# Patient Record
Sex: Female | Born: 2000 | Race: Black or African American | Hispanic: No | Marital: Single | Smoking: Former smoker
Health system: Southern US, Community
[De-identification: ages and names within clinical notes are randomized; demographics above are authoritative.]

## PROBLEM LIST (undated history)

## (undated) DIAGNOSIS — D649 Anemia, unspecified: Secondary | ICD-10-CM

## (undated) DIAGNOSIS — J45909 Unspecified asthma, uncomplicated: Secondary | ICD-10-CM

## (undated) DIAGNOSIS — F99 Mental disorder, not otherwise specified: Secondary | ICD-10-CM

## (undated) HISTORY — DX: Anemia, unspecified: D64.9

## (undated) HISTORY — PX: NO PAST SURGERIES: SHX2092

## (undated) HISTORY — DX: Mental disorder, not otherwise specified: F99

---
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
## 2020-07-27 ENCOUNTER — Other Ambulatory Visit: Payer: Self-pay

## 2020-07-27 ENCOUNTER — Encounter: Payer: Self-pay | Admitting: Emergency Medicine

## 2020-07-27 ENCOUNTER — Emergency Department: Payer: Medicaid Other

## 2020-07-27 ENCOUNTER — Emergency Department
Admission: EM | Admit: 2020-07-27 | Discharge: 2020-07-28 | Disposition: A | Discharge status: Home / Self Care | Payer: Medicaid Other | Attending: Emergency Medicine | Admitting: Emergency Medicine

## 2020-07-27 DIAGNOSIS — F1590 Other stimulant use, unspecified, uncomplicated: Secondary | ICD-10-CM | POA: Diagnosis not present

## 2020-07-27 DIAGNOSIS — R202 Paresthesia of skin: Secondary | ICD-10-CM

## 2020-07-27 DIAGNOSIS — J45909 Unspecified asthma, uncomplicated: Secondary | ICD-10-CM | POA: Diagnosis not present

## 2020-07-27 DIAGNOSIS — R200 Anesthesia of skin: Secondary | ICD-10-CM | POA: Diagnosis present

## 2020-07-27 LAB — COMPREHENSIVE METABOLIC PANEL
ALT: 10 U/L (ref 0–44)
AST: 17 U/L (ref 15–41)
Albumin: 4.4 g/dL (ref 3.5–5.0)
Alkaline Phosphatase: 121 U/L (ref 38–126)
Anion gap: 9 (ref 5–15)
BUN: 20 mg/dL (ref 6–20)
CO2: 26 mmol/L (ref 22–32)
Calcium: 9.4 mg/dL (ref 8.9–10.3)
Chloride: 105 mmol/L (ref 98–111)
Creatinine, Ser: 0.68 mg/dL (ref 0.44–1.00)
GFR calc non Af Amer: >60 mL/min (ref >60)
Glucose, Bld: 93 mg/dL (ref 70–99)
Potassium: 3.8 mmol/L (ref 3.5–5.1)
Sodium: 140 mmol/L (ref 135–145)
Total Bilirubin: 0.5 mg/dL (ref 0.3–1.2)
Total Protein: 7.6 g/dL (ref 6.5–8.1)

## 2020-07-27 LAB — DIFFERENTIAL
Abs Immature Granulocytes: 0.01 10*3/uL (ref 0.00–0.07)
Basophils Absolute: 0.1 10*3/uL (ref 0.0–0.1)
Basophils Relative: 1 %
Eosinophils Absolute: 0.1 10*3/uL (ref 0.0–0.5)
Eosinophils Relative: 2 %
Immature Granulocytes: 0 %
Lymphocytes Relative: 43 %
Lymphs Abs: 2.7 10*3/uL (ref 0.7–4.0)
Monocytes Absolute: 0.6 10*3/uL (ref 0.1–1.0)
Monocytes Relative: 10 %
Neutro Abs: 2.8 10*3/uL (ref 1.7–7.7)
Neutrophils Relative %: 44 %

## 2020-07-27 LAB — CBC
HCT: 38.4 % (ref 36.0–46.0)
Hemoglobin: 12.7 g/dL (ref 12.0–15.0)
MCH: 27 pg (ref 26.0–34.0)
MCHC: 33.1 g/dL (ref 30.0–36.0)
MCV: 81.7 fL (ref 80.0–100.0)
Platelets: 255 10*3/uL (ref 150–400)
RBC: 4.7 MIL/uL (ref 3.87–5.11)
RDW: 13.7 % (ref 11.5–15.5)
WBC: 6.3 10*3/uL (ref 4.0–10.5)
nRBC: 0 % (ref 0.0–0.2)

## 2020-07-27 LAB — PROTIME-INR
INR: 1 (ref 0.8–1.2)
Prothrombin Time: 12.7 seconds (ref 11.4–15.2)

## 2020-07-27 LAB — APTT: aPTT: 28 seconds (ref 24–36)

## 2020-07-27 LAB — GLUCOSE, CAPILLARY: Glucose-Capillary: 109 mg/dL — ABNORMAL HIGH (ref 70–99)

## 2020-07-27 LAB — TROPONIN I (HIGH SENSITIVITY): Troponin I (High Sensitivity): <2 ng/L (ref <18)
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
## 2020-07-27 NOTE — ED Triage Notes (Addendum)
Pt also reports intermittent chest pain recently, but states she has not had chest pain since last week and denies any pain currently.
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
## 2020-07-28 ENCOUNTER — Emergency Department: Payer: Medicaid Other

## 2020-07-28 LAB — FIBRIN DERIVATIVES D-DIMER (ARMC ONLY): Fibrin derivatives D-dimer (ARMC): 210.73 ng/mL (FEU) (ref 0.00–499.00)

## 2020-07-28 MED ORDER — GADOBUTROL 1 MMOL/ML IV SOLN
6.0000 mL | Freq: Once | INTRAVENOUS | Status: AC | PRN
  Administered 2020-07-28: 6 mL via INTRAVENOUS

## 2020-07-28 NOTE — ED Provider Notes (Signed)
[facility] Emergency Department Provider Note  ____________________________________________   First MD Initiated Contact with Patient 07/28/20 0000     (approximate)  I have reviewed the triage vital signs and the nursing notes.   HISTORY  Chief Complaint Numbness    HPI Kendra Oconnor is a 19 y.o. female with history of asthma presents to the emergency department secondary to 10-minute episode of left face/arm numbness which began at 6 PM while the patient was sitting on her couch.  Patient denies any weakness no nausea or vomiting.  Patient states that she did not have a headache at that point however has developed one since then.  Patient denies any fever no recent illness.  Patient denies any chest pain no shortness of breath.        Past Medical History:  Diagnosis Date  . Asthma     There are no problems to display for this patient.   History reviewed. No pertinent surgical history.  Prior to Admission medications   Not on File    Allergies Patient has no known allergies.  No family history on file.  Social History Social History   Tobacco Use  . Smoking status: Never Smoker  . Smokeless tobacco: Never Used  Vaping Use  . Vaping Use: Never used  Substance Use Topics  . Alcohol use: No  . Drug use: Yes    Types: Marijuana    Review of Systems Constitutional: No fever/chills Eyes: No visual changes. ENT: No sore throat. Cardiovascular: Denies chest pain. Respiratory: Denies shortness of breath. Gastrointestinal: No abdominal pain.  No nausea, no vomiting.  No diarrhea.  No constipation. Genitourinary: Negative for dysuria. Musculoskeletal: Negative for neck pain.  Negative for back pain. Integumentary: Negative for rash. Neurological: Negative for headaches, focal weakness.  Positive for left face/arm numbness  ____________________________________________   PHYSICAL EXAM:  VITAL SIGNS: ED Triage Vitals  Enc  Vitals Group     BP 07/27/20 2053 118/70     Pulse Rate 07/27/20 2053 82     Resp 07/27/20 2053 18     Temp --      Temp Source 07/27/20 2053 Oral     SpO2 07/27/20 2053 100 %     Weight 07/27/20 2101 65.3 kg (144 lb)     Height 07/27/20 2101 1.702 m (5\' 7" )     Head Circumference --      Peak Flow --      Pain Score 07/27/20 2101 4     Pain Loc --      Pain Edu? --      Excl. in GC? --     Constitutional: Alert and oriented.  Eyes: Conjunctivae are normal.  Head: Atraumatic. Mouth/Throat: Patient is wearing a mask. Neck: No stridor.  No meningeal signs.   Cardiovascular: Normal rate, regular rhythm. Good peripheral circulation. Grossly normal heart sounds. Respiratory: Normal respiratory effort.  No retractions. Gastrointestinal: Soft and nontender. No distention.  Musculoskeletal: No lower extremity tenderness nor edema. No gross deformities of extremities. Neurologic:  Normal speech and language. No gross focal neurologic deficits are appreciated.  Skin:  Skin is warm, dry and intact. Psychiatric: Mood and affect are normal. Speech and behavior are normal.  ____________________________________________   LABS (all labs ordered are listed, but only abnormal results are displayed)  Labs Reviewed  GLUCOSE, CAPILLARY - Abnormal; Notable for the following components:      Result Value   Glucose-Capillary 109 (*)    All  other components within normal limits  PROTIME-INR  APTT  CBC  DIFFERENTIAL  COMPREHENSIVE METABOLIC PANEL  FIBRIN DERIVATIVES D-DIMER ([facility] ONLY)  CBG MONITORING, ED  POC URINE PREG, ED  TROPONIN I (HIGH SENSITIVITY)   ____________________________________________  EKG  ED ECG REPORT I, Mosinee N Dewan Emond, the attending physician, personally viewed and interpreted this ECG.   Date: 07/27/2020  EKG Time: 9:01 PM  Rate: 82  Rhythm: Normal sinus rhythm  Axis: Normal  Intervals: Normal  ST&T Change:  None  ____________________________________________  RADIOLOGY I,  N Shaylin Blatt, personally viewed and evaluated these images (plain radiographs) as part of my medical decision making, as well as reviewing the written report by the radiologist.  ED MD interpretation: Negative CT head and MRI MRV  Official radiology report(s): CT HEAD WO CONTRAST  Result Date: 07/27/2020 CLINICAL DATA:  Left arm numbness and face numbness EXAM: CT HEAD WITHOUT CONTRAST TECHNIQUE: Contiguous axial images were obtained from the base of the skull through the vertex without intravenous contrast. COMPARISON:  None. FINDINGS: Brain: There is no acute intracranial hemorrhage, mass effect, or edema. Gray-white differentiation is preserved. There is no extra-axial fluid collection. Ventricles and sulci are within normal limits in size and configuration. Vascular: There is atherosclerotic calcification at the skull base. Skull: Calvarium is unremarkable. Sinuses/Orbits: No acute finding. Other: None. IMPRESSION: No acute intracranial hemorrhage or evidence of acute infarction. Electronically Signed   By: Guadlupe Spanish M.D.   On: 07/27/2020 21:37   MR BRAIN WO CONTRAST  Result Date: 07/28/2020 CLINICAL DATA:  Initial evaluation for neuro deficit, stroke suspected, left facial and arm numbness. EXAM: MRI HEAD WITHOUT CONTRAST MRV HEAD WITHOUT AND WITH CONTRAST TECHNIQUE: Multiplanar, multiecho pulse sequences of the brain and surrounding structures were obtained without intravenous contrast. Angiographic images of the intracranial venous structures were obtained using MRV technique without and with intravenous contrast. CONTRAST:  52mL GADAVIST GADOBUTROL 1 MMOL/ML IV SOLN COMPARISON:  None. FINDINGS: MRI HEAD FINDINGS: Brain: Cerebral volume within normal limits for patient age. No focal parenchymal signal abnormality identified. No abnormal foci of restricted diffusion to suggest acute or subacute ischemia. Gray-white matter  differentiation well maintained. No encephalomalacia to suggest chronic infarction. No foci of susceptibility artifact to suggest acute or chronic intracranial hemorrhage. No mass lesion, midline shift or mass effect. No hydrocephalus. No extra-axial fluid collection. Major dural sinuses are grossly patent. Pituitary gland and suprasellar region are normal. Midline structures intact and normal. Vascular: Major intracranial vascular flow voids well maintained and normal in appearance. Skull and upper cervical spine: Craniocervical junction normal. Visualized upper cervical spine within normal limits. Bone marrow signal intensity normal. No scalp soft tissue abnormality. Sinuses/Orbits: Globes and orbital soft tissues within normal limits. Paranasal sinuses are clear. No mastoid effusion. Inner ear structures normal. Other: None. MRV HEAD FINDINGS: Normal flow related signal seen throughout the superior sagittal sinus to the level of the torcula. Torcula itself is patent. Transverse and sigmoid sinuses are widely patent as are the visualized proximal internal jugular veins. Right transverse sinus dominant. Straight sinus, vein of Galen, internal cerebral veins, and basal veins of Rosenthal are patent. No evidence for dural sinus thrombosis. No dural sinus stenosis identified. No other pathologic enhancement noted within the brain. IMPRESSION: 1. Normal brain MRI. No acute intracranial abnormality identified. 2. Normal intracranial MRV. No evidence for dural sinus thrombosis. Electronically Signed   By: Rise Mu M.D.   On: 07/28/2020 02:58   MR MRV HEAD W WO CONTRAST  Result  Date: 07/28/2020 CLINICAL DATA:  Initial evaluation for neuro deficit, stroke suspected, left facial and arm numbness. EXAM: MRI HEAD WITHOUT CONTRAST MRV HEAD WITHOUT AND WITH CONTRAST TECHNIQUE: Multiplanar, multiecho pulse sequences of the brain and surrounding structures were obtained without intravenous contrast. Angiographic  images of the intracranial venous structures were obtained using MRV technique without and with intravenous contrast. CONTRAST:  7mL GADAVIST GADOBUTROL 1 MMOL/ML IV SOLN COMPARISON:  None. FINDINGS: MRI HEAD FINDINGS: Brain: Cerebral volume within normal limits for patient age. No focal parenchymal signal abnormality identified. No abnormal foci of restricted diffusion to suggest acute or subacute ischemia. Gray-white matter differentiation well maintained. No encephalomalacia to suggest chronic infarction. No foci of susceptibility artifact to suggest acute or chronic intracranial hemorrhage. No mass lesion, midline shift or mass effect. No hydrocephalus. No extra-axial fluid collection. Major dural sinuses are grossly patent. Pituitary gland and suprasellar region are normal. Midline structures intact and normal. Vascular: Major intracranial vascular flow voids well maintained and normal in appearance. Skull and upper cervical spine: Craniocervical junction normal. Visualized upper cervical spine within normal limits. Bone marrow signal intensity normal. No scalp soft tissue abnormality. Sinuses/Orbits: Globes and orbital soft tissues within normal limits. Paranasal sinuses are clear. No mastoid effusion. Inner ear structures normal. Other: None. MRV HEAD FINDINGS: Normal flow related signal seen throughout the superior sagittal sinus to the level of the torcula. Torcula itself is patent. Transverse and sigmoid sinuses are widely patent as are the visualized proximal internal jugular veins. Right transverse sinus dominant. Straight sinus, vein of Galen, internal cerebral veins, and basal veins of Rosenthal are patent. No evidence for dural sinus thrombosis. No dural sinus stenosis identified. No other pathologic enhancement noted within the brain. IMPRESSION: 1. Normal brain MRI. No acute intracranial abnormality identified. 2. Normal intracranial MRV. No evidence for dural sinus thrombosis. Electronically Signed    By: Rise Mu M.D.   On: 07/28/2020 02:58    ____________________________________________   PROCEDURES   Procedure(s) performed (including Critical Care):  Procedures   ____________________________________________   INITIAL IMPRESSION / MDM / ASSESSMENT AND PLAN / ED COURSE  As part of my medical decision making, I reviewed the following data within the electronic MEDICAL RECORD NUMBER 19 year old female presented with above-stated history and physical exam a differential diagnosis including but not limited to paresthesia, migraine, dural sinus thrombosis, CVA. CT head as well as MRI MRV negative. ____________________________________________  FINAL CLINICAL IMPRESSION(S) / ED DIAGNOSES  Final diagnoses:  Paresthesia     MEDICATIONS GIVEN DURING THIS VISIT:  Medications  gadobutrol (GADAVIST) 1 MMOL/ML injection 6 mL (6 mLs Intravenous Contrast Given 07/28/20 0201)     ED Discharge Orders    None      *Please note:  EUREKA VALDES was evaluated in Emergency Department on 07/28/2020 for the symptoms described in the history of present illness. She was evaluated in the context of the global COVID-19 pandemic, which necessitated consideration that the patient might be at risk for infection with the SARS-CoV-2 virus that causes COVID-19. Institutional protocols and algorithms that pertain to the evaluation of patients at risk for COVID-19 are in a state of rapid change based on information released by regulatory bodies including the CDC and federal and state organizations. These policies and algorithms were followed during the patient's care in the ED.  Some ED evaluations and interventions may be delayed as a result of limited staffing during and after the pandemic.*  Note:  This document was prepared using Dragon voice  recognition software and may include unintentional dictation errors.   Darci Current, MD 07/28/20 [phone]

## 2020-07-28 NOTE — ED Notes (Signed)
PT returned from MRI.

## 2020-07-28 NOTE — ED Notes (Signed)
ED Provider at bedside. 

## 2020-07-28 NOTE — ED Notes (Signed)
PT transported to MRI

## 2020-08-06 ENCOUNTER — Ambulatory Visit: Payer: Medicaid Other

## 2020-10-18 ENCOUNTER — Emergency Department: Payer: Medicaid Other

## 2020-10-18 ENCOUNTER — Other Ambulatory Visit: Payer: Self-pay

## 2020-10-18 ENCOUNTER — Emergency Department
Admission: EM | Admit: 2020-10-18 | Discharge: 2020-10-18 | Disposition: A | Discharge status: Home / Self Care | Payer: Medicaid Other | Attending: Emergency Medicine | Admitting: Emergency Medicine

## 2020-10-18 DIAGNOSIS — J069 Acute upper respiratory infection, unspecified: Secondary | ICD-10-CM | POA: Insufficient documentation

## 2020-10-18 DIAGNOSIS — U071 COVID-19: Secondary | ICD-10-CM | POA: Diagnosis not present

## 2020-10-18 DIAGNOSIS — R059 Cough, unspecified: Secondary | ICD-10-CM | POA: Diagnosis present

## 2020-10-18 LAB — RESP PANEL BY RT-PCR (FLU A&B, COVID) ARPGX2
Influenza A by PCR: NEGATIVE
Influenza B by PCR: NEGATIVE
SARS Coronavirus 2 by RT PCR: POSITIVE — AB

## 2020-10-18 MED ORDER — BENZONATATE 100 MG PO CAPS: 100.0000 mg | ORAL_CAPSULE | Freq: Three times a day (TID) | ORAL | 0 refills | Status: AC | PRN

## 2020-10-18 MED ORDER — ALBUTEROL SULFATE HFA 108 (90 BASE) MCG/ACT IN AERS: 2.0000 | INHALATION_SPRAY | Freq: Four times a day (QID) | RESPIRATORY_TRACT | 2 refills | Status: DC | PRN

## 2020-10-18 MED ORDER — ONDANSETRON 4 MG PO TBDP: 4.0000 mg | ORAL_TABLET | Freq: Three times a day (TID) | ORAL | 0 refills | Status: AC | PRN

## 2020-10-18 NOTE — ED Provider Notes (Signed)
[facility]-EMERGENCY DEPARTMENT  ____________________________________________  Time seen: Approximately 10:04 PM  I have reviewed the triage vital signs and the nursing notes.   HISTORY  Chief Complaint URI   Historian Patient    HPI Kendra Oconnor is a 19 y.o. female presents to the emergency department with viral URI-like symptoms that started last night.  Patient denies chest pain, chest tightness or abdominal pain.  No fever or chills at home.  No other alleviating measures have been attempted.   Past Medical History:  Diagnosis Date  . Asthma      Immunizations up to date:  Yes.     Past Medical History:  Diagnosis Date  . Asthma     There are no problems to display for this patient.   History reviewed. No pertinent surgical history.  Prior to Admission medications   Medication Sig Start Date End Date Taking? Authorizing Provider  albuterol (VENTOLIN HFA) 108 (90 Base) MCG/ACT inhaler Inhale 2 puffs into the lungs every 6 (six) hours as needed for wheezing or shortness of breath. 10/18/20  Yes Pia Mau M, PA-C  benzonatate (TESSALON PERLES) 100 MG capsule Take 1 capsule (100 mg total) by mouth 3 (three) times daily as needed for up to 7 days for cough. 10/18/20 10/25/20 Yes Pia Mau M, PA-C  ondansetron (ZOFRAN ODT) 4 MG disintegrating tablet Take 1 tablet (4 mg total) by mouth every 8 (eight) hours as needed for up to 5 days. 10/18/20 10/23/20 Yes Orvil Feil, PA-C    Allergies Patient has no known allergies.  Family History  Problem Relation Age of Onset  . Hypertension Maternal Grandmother   . Diabetes Maternal Grandfather     Social History Social History   Tobacco Use  . Smoking status: Never Smoker  . Smokeless tobacco: Never Used  Vaping Use  . Vaping Use: Never used  Substance Use Topics  . Alcohol use: No  . Drug use: Yes    Types: Marijuana     Review of Systems  Constitutional: No fever/chills Eyes:  No discharge ENT:  No upper respiratory complaints. Respiratory: Patient has cough and congestion.  Gastrointestinal:   No nausea, no vomiting.  No diarrhea.  No constipation. Musculoskeletal: Negative for musculoskeletal pain. Skin: Negative for rash, abrasions, lacerations, ecchymosis.    ____________________________________________   PHYSICAL EXAM:  VITAL SIGNS: ED Triage Vitals  Enc Vitals Group     BP 10/18/20 1903 117/60     Pulse Rate 10/18/20 1903 82     Resp 10/18/20 1903 18     Temp 10/18/20 1903 98.5 F (36.9 C)     Temp Source 10/18/20 1903 Oral     SpO2 10/18/20 1903 100 %     Weight 10/18/20 1904 147 lb (66.7 kg)     Height 10/18/20 1904 5\' 7"  (1.702 m)     Head Circumference --      Peak Flow --      Pain Score 10/18/20 1904 0     Pain Loc --      Pain Edu? --      Excl. in GC? --      Constitutional: Alert and oriented. Patient is lying supine. Eyes: Conjunctivae are normal. PERRL. EOMI. Head: Atraumatic. ENT:      Ears: Tympanic membranes are mildly injected with mild effusion bilaterally.       Nose: No congestion/rhinnorhea.      Mouth/Throat: Mucous membranes are moist. Posterior pharynx is mildly erythematous.  Hematological/Lymphatic/Immunilogical: No cervical  lymphadenopathy.  Cardiovascular: Normal rate, regular rhythm. Normal S1 and S2.  Good peripheral circulation. Respiratory: Normal respiratory effort without tachypnea or retractions. Lungs CTAB. Good air entry to the bases with no decreased or absent breath sounds. Gastrointestinal: Bowel sounds 4 quadrants. Soft and nontender to palpation. No guarding or rigidity. No palpable masses. No distention. No CVA tenderness. Musculoskeletal: Full range of motion to all extremities. No gross deformities appreciated. Neurologic:  Normal speech and language. No gross focal neurologic deficits are appreciated.  Skin:  Skin is warm, dry and intact. No rash noted. Psychiatric: Mood and affect are normal. Speech and  behavior are normal. Patient exhibits appropriate insight and judgement.    ____________________________________________   LABS (all labs ordered are listed, but only abnormal results are displayed)  Labs Reviewed  RESP PANEL BY RT-PCR (FLU A&B, COVID) ARPGX2 - Abnormal; Notable for the following components:      Result Value   SARS Coronavirus 2 by RT PCR POSITIVE (*)    All other components within normal limits   ____________________________________________  EKG   ____________________________________________  RADIOLOGY Geraldo Pitter, personally viewed and evaluated these images (plain radiographs) as part of my medical decision making, as well as reviewing the written report by the radiologist.  DG Chest 2 View  Result Date: 10/18/2020 CLINICAL DATA:  Cough and fatigue. Exposed to of COVID positive patient. EXAM: CHEST - 2 VIEW COMPARISON:  Chest x-ray 12/07/2016 FINDINGS: The heart size and mediastinal contours are within normal limits. Nipple shadows lower lobes are noted. No focal consolidation. No pulmonary edema. No pleural effusion. No pneumothorax. No acute osseous abnormality. IMPRESSION: No active cardiopulmonary disease. Electronically Signed   By: Tish Frederickson M.D.   On: 10/18/2020 19:56    ____________________________________________    PROCEDURES  Procedure(s) performed:     Procedures     Medications - No data to display   ____________________________________________   INITIAL IMPRESSION / ASSESSMENT AND PLAN / ED COURSE  Pertinent labs & imaging results that were available during my care of the patient were reviewed by me and considered in my medical decision making (see chart for details).      Assessment and plan Viral upper respiratory tract infection 19 year old female presents to the emergency department with viral URI-like symptoms.  Vital signs are reassuring at triage.  On physical exam, patient was alert, active and  nontoxic-appearing.  No adventitious lung sounds were auscultated.  Patient tested positive for COVID-19.   Patient was discharged with albuterol, Tessalon Perles and Zofran.  Return precautions were given to return with new or worsening symptoms.  All patient questions were answered.    ____________________________________________  FINAL CLINICAL IMPRESSION(S) / ED DIAGNOSES  Final diagnoses:  Viral upper respiratory tract infection      NEW MEDICATIONS STARTED DURING THIS VISIT:  ED Discharge Orders         Ordered    benzonatate (TESSALON PERLES) 100 MG capsule  3 times daily PRN        10/18/20 1953    ondansetron (ZOFRAN ODT) 4 MG disintegrating tablet  Every 8 hours PRN        10/18/20 1953    albuterol (VENTOLIN HFA) 108 (90 Base) MCG/ACT inhaler  Every 6 hours PRN        10/18/20 1953              This chart was dictated using voice recognition software/Dragon. Despite best efforts to proofread, errors can occur which can  change the meaning. Any change was purely unintentional.     Gasper Lloyd 10/18/20 2211    Delton [place], MD 10/18/20 2352

## 2020-10-18 NOTE — Discharge Instructions (Signed)
You have been prescribed Tessalon Perles for cough. You have been prescribed Zofran for nausea. You have been an albuterol inhaler to use as needed for shortness of breath and wheezing. Please return to the emergency department with new or worsening symptoms.

## 2020-10-18 NOTE — ED Notes (Signed)
Pt called by Clinical research associate to inform of COVID + result and to provide education on CDC recommendations and precautions.

## 2020-10-18 NOTE — ED Triage Notes (Addendum)
PT to ED for "covid sx"  Such as cough and fatigue.  PT was exposed to a known positive pt.

## 2020-10-19 ENCOUNTER — Encounter: Payer: Self-pay | Admitting: Physician Assistant

## 2021-03-01 ENCOUNTER — Encounter: Payer: Self-pay | Admitting: Advanced Practice Midwife

## 2021-03-01 ENCOUNTER — Other Ambulatory Visit: Payer: Self-pay

## 2021-03-01 ENCOUNTER — Ambulatory Visit (LOCAL_COMMUNITY_HEALTH_CENTER): Payer: Medicaid Other | Admitting: Advanced Practice Midwife

## 2021-03-01 VITALS — BP 106/70 | Ht 67.0 in | Wt 149.0 lb

## 2021-03-01 DIAGNOSIS — Z01419 Encounter for gynecological examination (general) (routine) without abnormal findings: Secondary | ICD-10-CM

## 2021-03-01 DIAGNOSIS — Z3009 Encounter for other general counseling and advice on contraception: Secondary | ICD-10-CM

## 2021-03-01 DIAGNOSIS — A599 Trichomoniasis, unspecified: Secondary | ICD-10-CM | POA: Insufficient documentation

## 2021-03-01 DIAGNOSIS — F1290 Cannabis use, unspecified, uncomplicated: Secondary | ICD-10-CM | POA: Diagnosis not present

## 2021-03-01 DIAGNOSIS — J45909 Unspecified asthma, uncomplicated: Secondary | ICD-10-CM

## 2021-03-01 DIAGNOSIS — Z309 Encounter for contraceptive management, unspecified: Secondary | ICD-10-CM | POA: Diagnosis not present

## 2021-03-01 DIAGNOSIS — F17200 Nicotine dependence, unspecified, uncomplicated: Secondary | ICD-10-CM | POA: Diagnosis not present

## 2021-03-01 LAB — WET PREP FOR TRICH, YEAST, CLUE
Trichomonas Exam: POSITIVE — AB
Yeast Exam: NEGATIVE

## 2021-03-01 MED ORDER — METRONIDAZOLE 500 MG PO TABS: 500.0000 mg | ORAL_TABLET | Freq: Two times a day (BID) | ORAL | 0 refills | Status: AC

## 2021-03-01 NOTE — Addendum Note (Signed)
Addended by: Tawny Hopping A on: 03/01/2021 01:38 PM   Modules accepted: Orders

## 2021-03-01 NOTE — Progress Notes (Signed)
[facility] [address] Main Number: [phone]    Family Planning Visit- Initial Visit  Subjective:  Kendra Oconnor is a 20 y.o. SBF G1P1001 (1 yo daughter)  being seen today for an initial annual visit.  The patient is currently using None for pregnancy prevention. Patient reports she does not want a pregnancy in the next year.  Patient has the following medical conditions has Trichomonas infection 03/01/21 and Marijuana use daily on their problem list.  Chief Complaint  Patient presents with  . Gynecologic Exam    Patient reports here for physical and STD exam.  LMP 02/05/21. Last sex 01/21/21 with condom; with current partner x 1 year; 2 partners in last 3 mo. Doesn't want pregnancy in next year. Last physical 10/25/19 pp at [facility]. Last MJ yesterday. Last ETOH 02/19/21 (1 Margarita)q o month. Doesn't want any birth control.  Not working, not in school. Living with her mom and baby.  Patient denies cigs, vaping, cigars   Body mass index is 23.34 kg/m. - Patient is eligible for diabetes screening based on BMI and age >65?  not applicable HA1C ordered? not applicable  Patient reports 4  partner/s in last year. Desires STI screening?  Yes  Has patient been screened once for HCV in the past?  No  No results found for: HCVAB  Does the patient have current drug use (including MJ), have a partner with drug use, and/or has been incarcerated since last result? Yes  If yes-- Screen for HCV through [facility]   Does the patient meet criteria for HBV testing? No  Criteria:  -Household, sexual or needle sharing contact with HBV -History of drug use -HIV positive -Those with known Hep C   Health Maintenance Due  Topic Date Due  . HPV VACCINES (1 - 2-dose series) Never done  . Hepatitis C Screening  Never done    Review of Systems  Genitourinary: Positive for frequency (c/o frequency x 3 wks with blood from vagina, voiding  normal amts).  All other systems reviewed and are negative.   The following portions of the patient's history were reviewed and updated as appropriate: allergies, current medications, past family history, past medical history, past social history, past surgical history and problem list. Problem list updated.   See flowsheet for other program required questions.  Objective:   Vitals:   03/01/21 1041  BP: 106/70  Weight: 149 lb (67.6 kg)  Height: 5\' 7"  (1.702 m)    Physical Exam Constitutional:      Appearance: Normal appearance. She is normal weight.  HENT:     Head: Normocephalic and atraumatic.     Comments: Thyroid without masses or enlargement Negative cervical lymphadenopathy    Mouth/Throat:     Mouth: Mucous membranes are moist.     Comments: Last dental exam >1 year ago--urged exam No visible caries Eyes:     Conjunctiva/sclera: Conjunctivae normal.  Cardiovascular:     Rate and Rhythm: Normal rate and regular rhythm.  Pulmonary:     Effort: Pulmonary effort is normal.     Breath sounds: Normal breath sounds.  Chest:  Breasts:     Right: Normal.     Left: Normal.    Abdominal:     Palpations: Abdomen is soft.     Comments: Soft without masses or tenderness  Genitourinary:    General: Normal vulva.     Exam position: Lithotomy position.  Vagina: Vaginal discharge (grey leukorrhea, ph>4.5, c/o spotting pink x1 mo and postcoital bleeding) present.     Cervix: Friability (friable to cultures) present.     Uterus: Normal.      Adnexa: Right adnexa normal and left adnexa normal.     Rectum: Normal.  Musculoskeletal:        General: Normal range of motion.     Cervical back: Normal range of motion and neck supple.  Skin:    General: Skin is warm and dry.  Neurological:     Mental Status: She is alert.  Psychiatric:        Mood and Affect: Mood normal.       Assessment and Plan:  Kendra Oconnor is a 20 y.o. female presenting to the [facility] Department for an initial annual wellness/contraceptive visit  Contraception counseling: Reviewed all forms of birth control options in the tiered based approach. available including abstinence; over the counter/barrier methods; hormonal contraceptive medication including pill, patch, ring, injection,contraceptive implant, ECP; hormonal and nonhormonal IUDs; permanent sterilization options including vasectomy and the various tubal sterilization modalities. Risks, benefits, and typical effectiveness rates were reviewed.  Questions were answered.  Written information was also given to the patient to review.  Patient desires nothing, this was prescribed for patient. She will follow up in  prn for surveillance.  She was told to call with any further questions, or with any concerns about this method of contraception.  Emphasized use of condoms 100% of the time for STI prevention.  Patient was offered ECP.Marland Kitchen ECP was not accepted by the patient. ECP counseling was not given - see RN documentation  1. Family planning Treat wet mount per standing orders Immunization nurse consult - WET PREP FOR TRICH, YEAST, CLUE  2. Well woman exam with routine gynecological exam  - Chlamydia/Gonorrhea [facility]  3. Trichomonas infection 03/01/21 Treat per standing orders  4. Marijuana use daily      Return if symptoms worsen or fail to improve.  No future appointments.  Alberteen Spindle, CNM

## 2021-03-01 NOTE — Progress Notes (Signed)
Wet Mount results reviewed. Patient treated for Trich per standing orders. Jamesia Linnen, RN  

## 2021-03-01 NOTE — Progress Notes (Signed)
Here today for a PE. Patient states last PE was "years ago." Last PE records in  [facility] was PP PE 11/13/2019. Patient declines birth control but requests STD screening declines bloodwork. Tawny Hopping, RN

## 2021-05-30 ENCOUNTER — Ambulatory Visit: Payer: Medicaid Other

## 2021-12-01 ENCOUNTER — Ambulatory Visit: Payer: Medicaid Other | Admitting: Family Medicine

## 2021-12-01 ENCOUNTER — Other Ambulatory Visit: Payer: Self-pay

## 2021-12-01 ENCOUNTER — Encounter: Payer: Self-pay | Admitting: Family Medicine

## 2021-12-01 DIAGNOSIS — Z113 Encounter for screening for infections with a predominantly sexual mode of transmission: Secondary | ICD-10-CM

## 2021-12-01 DIAGNOSIS — B9689 Other specified bacterial agents as the cause of diseases classified elsewhere: Secondary | ICD-10-CM

## 2021-12-01 LAB — WET PREP FOR TRICH, YEAST, CLUE
Trichomonas Exam: NEGATIVE
Yeast Exam: NEGATIVE

## 2021-12-01 MED ORDER — METRONIDAZOLE 500 MG PO TABS: 500.0000 mg | ORAL_TABLET | Freq: Two times a day (BID) | ORAL | 0 refills | Status: AC

## 2021-12-01 NOTE — Progress Notes (Signed)
[facility] Department  STI clinic/screening visit [address] [place] [address] [phone]  Subjective:  Kendra Oconnor is a 21 y.o. female being seen today for an STI screening visit. The patient reports they do have symptoms.  Patient reports that they do not desire a pregnancy in the next year.   They reported they are not interested in discussing contraception today.    Patient's last menstrual period was 11/06/2021 (approximate).   Patient has the following medical conditions:   Patient Active Problem List   Diagnosis Date Noted   Trichomonas infection 03/01/21 03/01/2021   Marijuana use daily 03/01/2021   Asthma 03/01/2021    Chief Complaint  Patient presents with   SEXUALLY TRANSMITTED DISEASE    screening    HPI  Patient reports here for screening, reports s/sx   No previous HIV testing,  No pap d/t age   Screening for MPX risk: Does the patient have an unexplained rash? No Is the patient MSM? No Does the patient endorse multiple sex partners or anonymous sex partners? No Did the patient have close or sexual contact with a person diagnosed with MPX? No Has the patient traveled outside the [place] where MPX is endemic? No Is there a high clinical suspicion for MPX-- evidenced by one of the following No  -Unlikely to be chickenpox  -Lymphadenopathy  -Rash that present in same phase of evolution on any given body part See flowsheet for further details and programmatic requirements.    The following portions of the patient's history were reviewed and updated as appropriate: allergies, current medications, past medical history, past social history, past surgical history and problem list.  Objective:  There were no vitals filed for this visit.  Physical Exam Vitals and nursing note reviewed.  Constitutional:      Appearance: Normal appearance.  HENT:     Head: Normocephalic and atraumatic.     Mouth/Throat:     Mouth: Mucous  membranes are moist.     Pharynx: Oropharynx is clear. No oropharyngeal exudate or posterior oropharyngeal erythema.  Pulmonary:     Effort: Pulmonary effort is normal.  Abdominal:     General: Abdomen is flat.     Palpations: There is no mass.     Tenderness: There is no abdominal tenderness. There is no rebound.  Genitourinary:    General: Normal vulva.     Exam position: Lithotomy position.     Pubic Area: No rash or pubic lice.      Labia:        Right: No rash or lesion.        Left: No rash or lesion.      Vagina: Normal. No vaginal discharge, erythema, bleeding or lesions.     Cervix: No cervical motion tenderness, discharge, friability, lesion or erythema.     Uterus: Normal.      Adnexa: Right adnexa normal and left adnexa normal.     Rectum: Normal.     Comments: External genitalia without, lice, nits, erythema, edema , lesions or inguinal adenopathy. Vagina with normal mucosa and white discharge and pH > 4.  Cervix without visual lesions, uterus firm, mobile, non-tender, no masses, CMT adnexal fullness or tenderness.   Lymphadenopathy:     Head:     Right side of head: No preauricular or posterior auricular adenopathy.     Left side of head: No preauricular or posterior auricular adenopathy.     Cervical: No cervical adenopathy.  Upper Body:     Right upper body: No supraclavicular or axillary adenopathy.     Left upper body: No supraclavicular or axillary adenopathy.     Lower Body: No right inguinal adenopathy. No left inguinal adenopathy.  Skin:    General: Skin is warm and dry.     Findings: No rash.  Neurological:     Mental Status: She is alert and oriented to person, place, and time.     Assessment and Plan:  Kendra Oconnor is a 21 y.o. female presenting to the [facility] Department for STI screening  1. Screening examination for venereal disease Patient accepted all screenings including wet prep, oral, vaginal CT/GC and declined bloodwork  for HIV/RPR.  Patient meets criteria for HepB screening? Yes. Ordered? No - declined  Patient meets criteria for HepC screening? Yes. Ordered? No - declined   Wet prep results neg    Treatment needed for BV  Discussed time line for [facility] results and that patient will be called with positive results and encouraged patient to call if she had not heard in 2 weeks.  Counseled to return or seek care for continued or worsening symptoms Recommended condom use with all sex  Patient is currently using  No BCM   to prevent pregnancy.   - Chlamydia/Gonorrhea [place] [facility] - WET PREP FOR TRICH, YEAST, CLUE - Chlamydia/Gonorrhea [place] [facility]  2. Bacterial vaginosis  - metroNIDAZOLE (FLAGYL) 500 MG tablet; Take 1 tablet (500 mg total) by mouth 2 (two) times daily for 7 days.  Dispense: 14 tablet; Refill: 0     No follow-ups on file.  No future appointments.  Wendi Snipes, FNP

## 2021-12-01 NOTE — Progress Notes (Signed)
Pt here for STD screening.  Wet mount results reviewed.  Medication dispensed per Provider orders. Pt declined condoms. Sutter Ahlgren M Gurney Balthazor, RN  

## 2021-12-05 IMAGING — CT CT HEAD W/O CM
3 series · 16 of 47 positions shown, 19 images · non-contrast
Comparison: None.

CLINICAL DATA: Left arm numbness and face numbness

EXAM:
CT HEAD WITHOUT CONTRAST
TECHNIQUE: Contiguous axial images were obtained from the base of the skull
through the vertex without intravenous contrast.

[Series 2: head wo · axial · 0.43mm/px · z∈[+367,+492]mm · 10 of 31 slices shown, 13 images]
[im 3/31  brain]
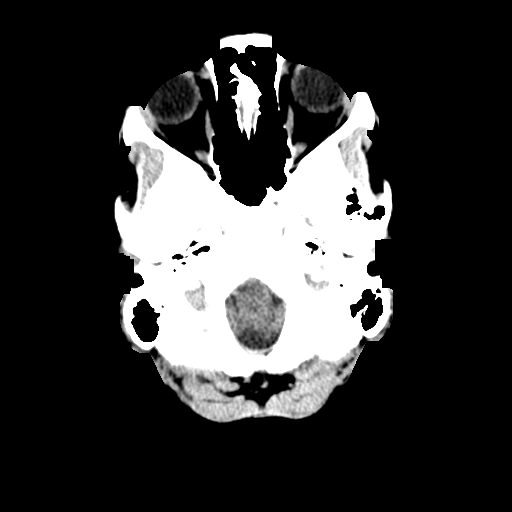
[im 3/31  bone]
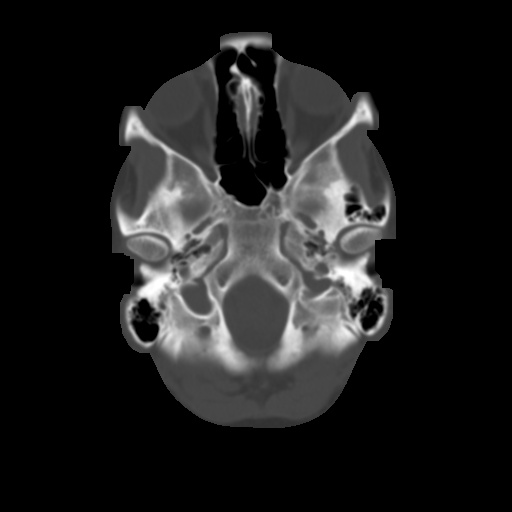
[im 6/31  brain]
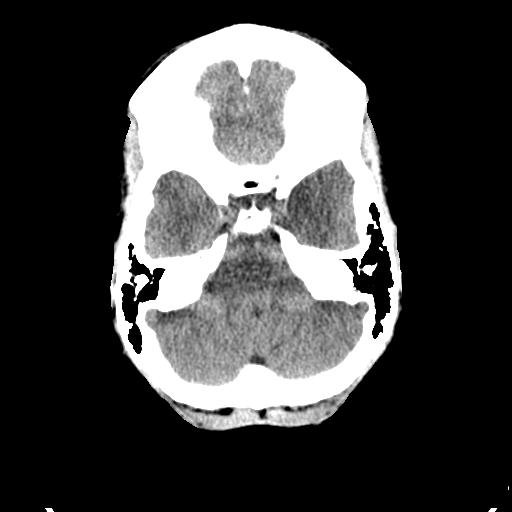
[im 9/31  brain]
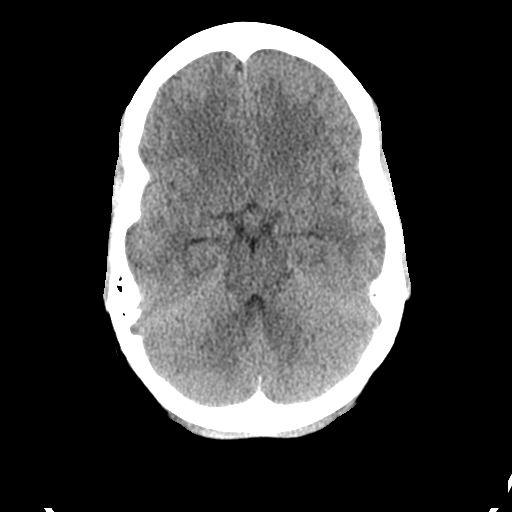
[im 11/31  brain]
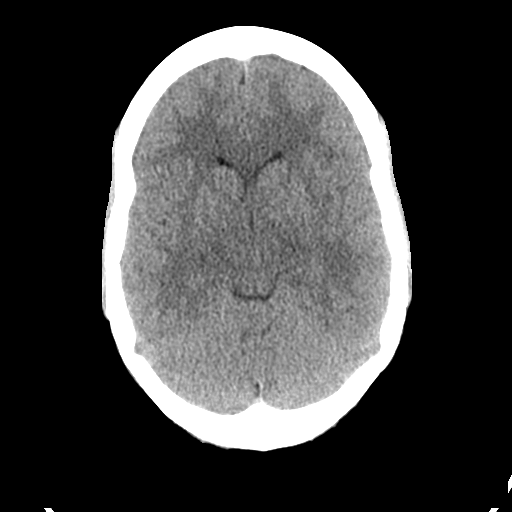
[im 14/31  brain]
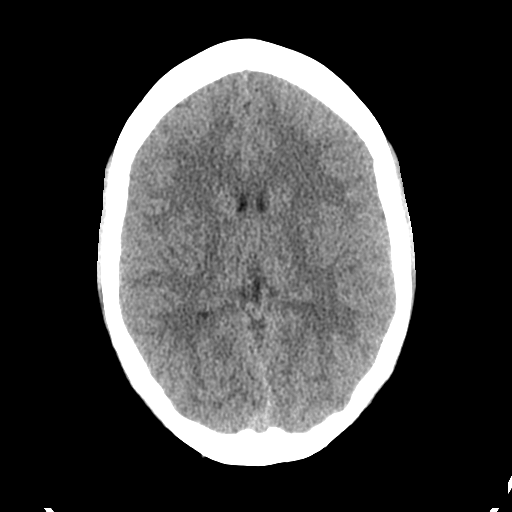
[im 14/31  bone]
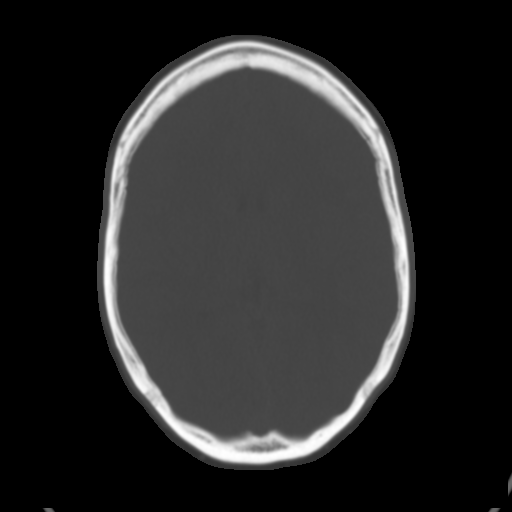
[im 17/31  brain]
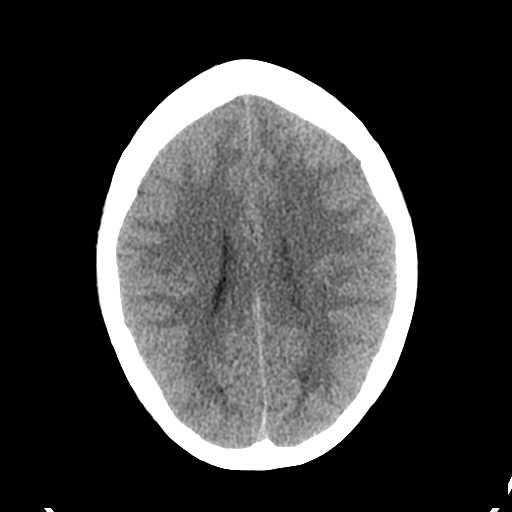
[im 20/31  brain]
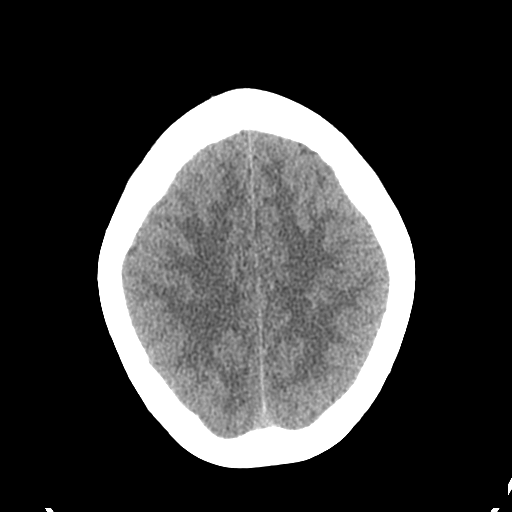
[im 23/31  brain]
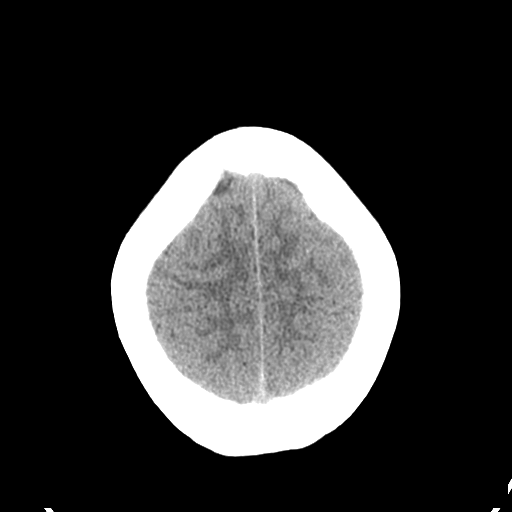
[im 25/31  brain]
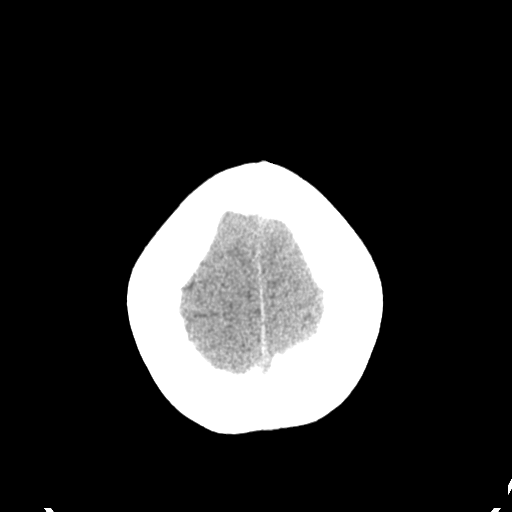
[im 25/31  bone]
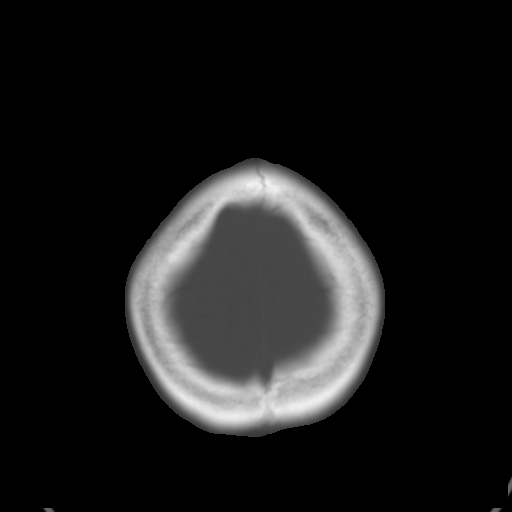
[im 28/31  brain]
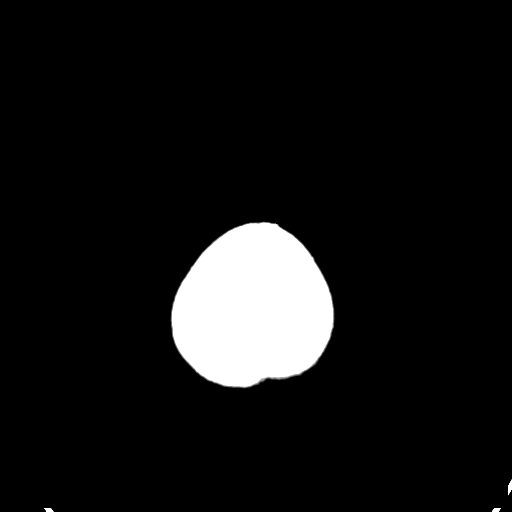

[Series 4: coronal soft tissue · coronal · 0.31mm/px · 3 of 66 slices shown]
[im 22/66  brain]
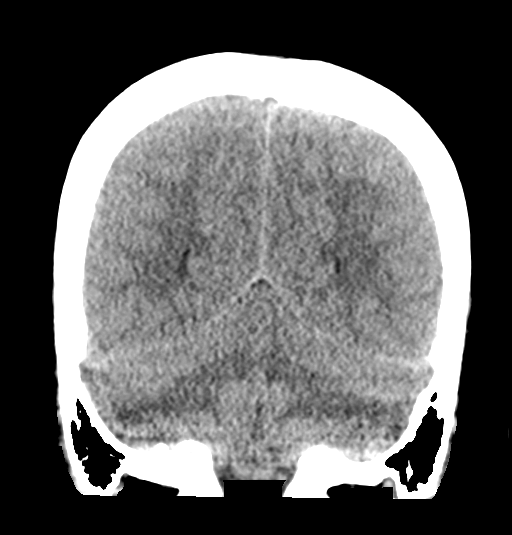
[im 29/66  brain]
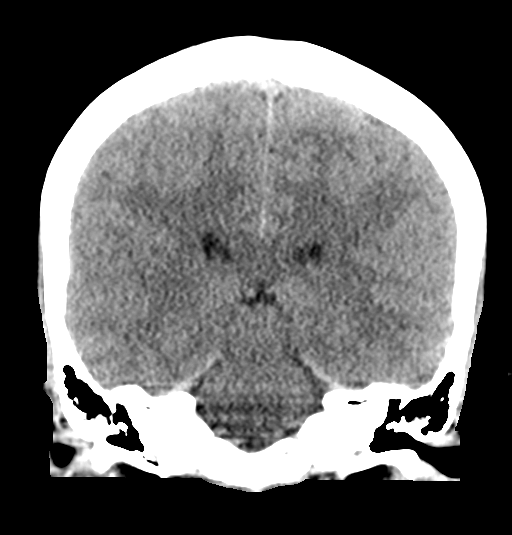
[im 37/66  brain]
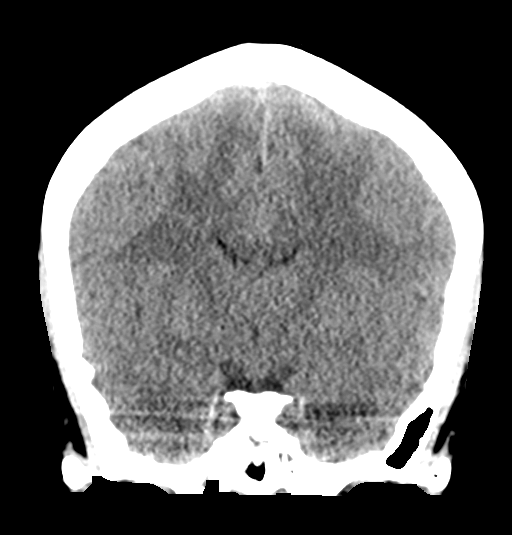

[Series 5: sagittal soft tissue · sagittal · 0.32mm/px · 3 of 50 slices shown]
[im 17/50  brain]
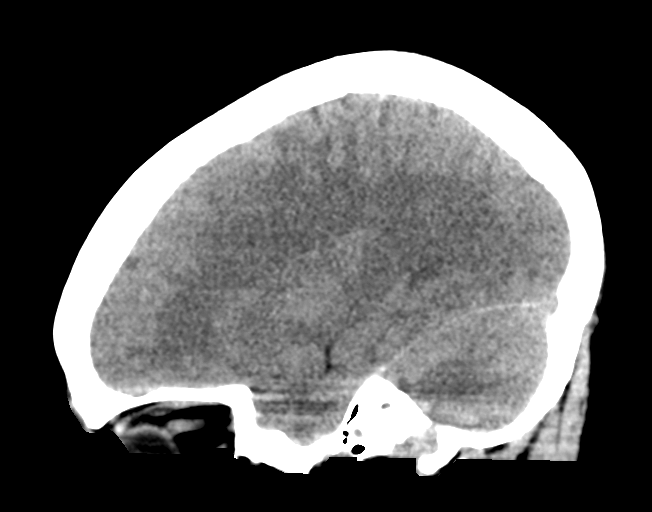
[im 25/50  brain]
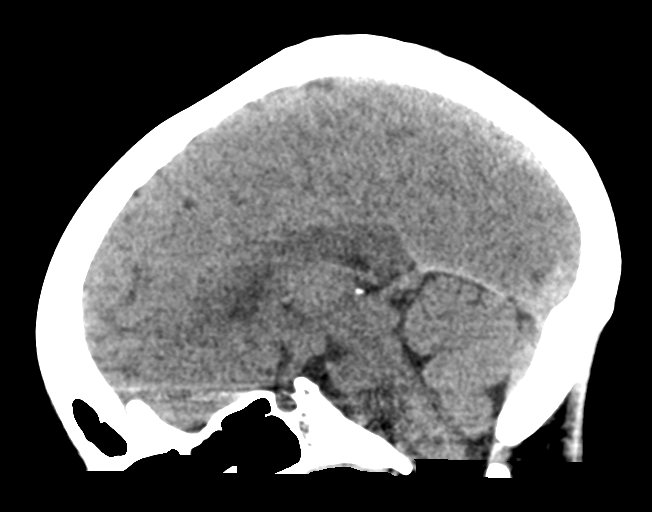
[im 33/50  brain]
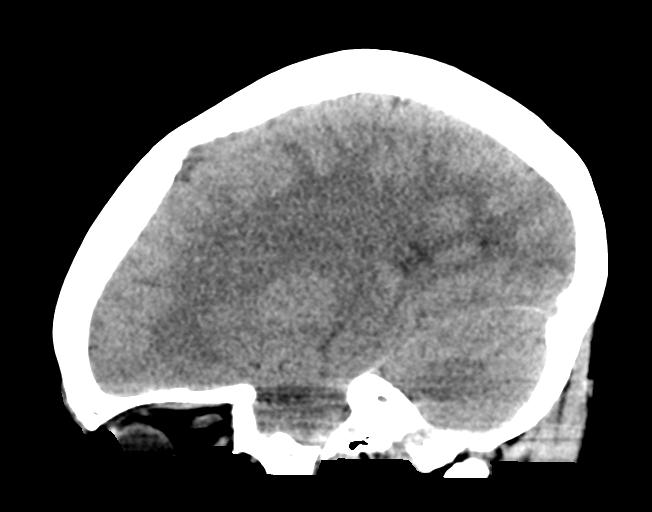

[16 of 47 positions shown; findings below may reference images not displayed]

FINDINGS: Brain: There is no acute intracranial hemorrhage, mass effect, or
edema. Gray-white differentiation is preserved. There is no
extra-axial fluid collection. Ventricles and sulci are within normal
limits in size and configuration.

Vascular: There is atherosclerotic calcification at the skull base.

Skull: Calvarium is unremarkable.

Sinuses/Orbits: No acute finding.

Other: None.
IMPRESSION: No acute intracranial hemorrhage or evidence of acute infarction.

## 2022-05-17 ENCOUNTER — Encounter: Payer: Self-pay | Admitting: Advanced Practice Midwife

## 2022-05-17 ENCOUNTER — Ambulatory Visit: Payer: Medicaid Other | Admitting: Advanced Practice Midwife

## 2022-05-17 DIAGNOSIS — B9689 Other specified bacterial agents as the cause of diseases classified elsewhere: Secondary | ICD-10-CM

## 2022-05-17 DIAGNOSIS — N760 Acute vaginitis: Secondary | ICD-10-CM

## 2022-05-17 DIAGNOSIS — Z113 Encounter for screening for infections with a predominantly sexual mode of transmission: Secondary | ICD-10-CM | POA: Diagnosis not present

## 2022-05-17 LAB — WET PREP FOR TRICH, YEAST, CLUE
Trichomonas Exam: NEGATIVE
Yeast Exam: NEGATIVE

## 2022-05-17 MED ORDER — METRONIDAZOLE 500 MG PO TABS: 500.0000 mg | ORAL_TABLET | Freq: Two times a day (BID) | ORAL | 0 refills | Status: AC

## 2022-05-17 NOTE — Progress Notes (Signed)
Wet mount reviewed during clinic visit. Treatment given for BV per SO. Medication dispensed to patient (Metronidazole 500mg  BOD for 7 days), BV pamphlet, education and all questions answered.    , RN

## 2022-05-17 NOTE — Addendum Note (Signed)
Addended by: Earlyne Iba on: 05/17/2022 03:39 PM   Modules accepted: Orders

## 2022-05-17 NOTE — Progress Notes (Signed)
[facility] Department  STI clinic/screening visit [address] [place] [address] [phone]  Subjective:  Kendra Oconnor is a 21 y.o. SBF exvaper G1P1  female being seen today for an STI screening visit. The patient reports they do have symptoms.  Patient reports that they do not desire a pregnancy in the next year.   They reported they are not interested in discussing contraception today.    Patient's last menstrual period was 05/01/2022 (approximate).   Patient has the following medical conditions:   Patient Active Problem List   Diagnosis Date Noted   Trichomonas infection 03/01/21 03/01/2021   Marijuana use daily 03/01/2021   Asthma 03/01/2021    Chief Complaint  Patient presents with   SEXUALLY TRANSMITTED DISEASE    HPI  Patient reports c/o increased white d/c x 2 wks. LMP 05/01/22. Last sex 05/10/22 without condom; first time with this partner; 2 sex partners in last 2 mo. Last MJ yesterday. Last ETOH yesterday (1 mixed drink) qo weekend.  Last HIV test per patient/review of record was 07/10/2019 Patient reports last pap was never  Screening for MPX risk: Does the patient have an unexplained rash? No Is the patient MSM? No Does the patient endorse multiple sex partners or anonymous sex partners? No Did the patient have close or sexual contact with a person diagnosed with MPX? No Has the patient traveled outside the [place] where MPX is endemic? No Is there a high clinical suspicion for MPX-- evidenced by one of the following No  -Unlikely to be chickenpox  -Lymphadenopathy  -Rash that present in same phase of evolution on any given body part See flowsheet for further details and programmatic requirements.   Immunization history:  Immunization History  Administered Date(s) Administered   Tdap 01/31/2019     The following portions of the patient's history were reviewed and updated as appropriate: allergies, current medications, past medical  history, past social history, past surgical history and problem list.  Objective:  There were no vitals filed for this visit.  Physical Exam Vitals and nursing note reviewed.  Constitutional:      Appearance: Normal appearance. She is normal weight.  HENT:     Head: Normocephalic and atraumatic.     Mouth/Throat:     Mouth: Mucous membranes are moist.     Pharynx: Oropharynx is clear. No oropharyngeal exudate or posterior oropharyngeal erythema.  Eyes:     Conjunctiva/sclera: Conjunctivae normal.  Pulmonary:     Effort: Pulmonary effort is normal.  Abdominal:     General: Abdomen is flat.     Palpations: Abdomen is soft. There is no mass.     Tenderness: There is no abdominal tenderness. There is no rebound.     Comments: Soft without masses or tenderness, good tone  Genitourinary:    General: Normal vulva.     Exam position: Lithotomy position.     Pubic Area: No rash or pubic lice.      Labia:        Right: No rash or lesion.        Left: No rash or lesion.      Vagina: Vaginal discharge (white creamy leukorrhea, ph<4.5) present. No erythema, bleeding or lesions.     Cervix: Normal.     Uterus: Normal.      Adnexa: Right adnexa normal and left adnexa normal.     Rectum: Normal.     Comments: pH = <4.5 Lymphadenopathy:  Head:     Right side of head: No preauricular or posterior auricular adenopathy.     Left side of head: No preauricular or posterior auricular adenopathy.     Cervical: No cervical adenopathy.     Right cervical: No superficial, deep or posterior cervical adenopathy.    Left cervical: No superficial, deep or posterior cervical adenopathy.     Upper Body:     Right upper body: No supraclavicular, axillary or epitrochlear adenopathy.     Left upper body: No supraclavicular, axillary or epitrochlear adenopathy.     Lower Body: No right inguinal adenopathy. No left inguinal adenopathy.  Skin:    General: Skin is warm and dry.     Findings: No rash.   Neurological:     Mental Status: She is alert and oriented to person, place, and time.      Assessment and Plan:  Kendra Oconnor is a 21 y.o. female presenting to the [facility] Department for STI screening  1. Screening examination for venereal disease Treat wet mount per standing orders Immunization nurse consult - Chlamydia/Gonorrhea [facility] - WET PREP FOR TRICH, YEAST, CLUE     No follow-ups on file.  No future appointments.  Alberteen Spindle, CNM

## 2022-05-26 ENCOUNTER — Telehealth: Payer: Self-pay

## 2022-05-26 DIAGNOSIS — A749 Chlamydial infection, unspecified: Secondary | ICD-10-CM

## 2022-05-26 HISTORY — DX: Chlamydial infection, unspecified: A74.9

## 2022-05-26 NOTE — Telephone Encounter (Signed)
Calling pt re positive chlamydia result from 05/17/22 vaginal specimen. Pt needs tx appt.  Phone call to [phone]. Left message on voicemail that RN with [facility] is calling re TR. Please call Tesla Bochicchio at [phone].  Also sent MyChart message.

## 2022-05-26 NOTE — Telephone Encounter (Signed)
Pt returned call and confirmed password. Counseled pt re + CT.  Pt states she can come in Tuesday for tx. Also states not using BC, LMP=04/29/22 -normal period, has had sex since last LMP. Pt counseled to eat before coming for tx appt.  Tx appt scheduled for 05/30/22.

## 2022-05-28 ENCOUNTER — Ambulatory Visit
Admission: EM | Admit: 2022-05-28 | Discharge: 2022-05-28 | Disposition: A | Discharge status: Home / Self Care | Payer: Medicaid Other | Attending: Emergency Medicine | Admitting: Emergency Medicine

## 2022-05-28 DIAGNOSIS — H5712 Ocular pain, left eye: Secondary | ICD-10-CM

## 2022-05-28 MED ORDER — MOXIFLOXACIN HCL 0.5 % OP SOLN: 1.0000 [drp] | Freq: Three times a day (TID) | OPHTHALMIC | 0 refills | Status: DC

## 2022-05-28 NOTE — Discharge Instructions (Signed)
Very abnormalities are noted on your exam, you were able to track objects and your movement of your eye is intact  We have attempted eye washing to help minimize your symptoms but were unsuccessful, you may continue to do so at home  We will prophylactically place you on antibiotics, Place 1 drop in the eye every 8 hours for the next 7 days  Please call the eye doctor tomorrow, information on the front of your paperwork to schedule an appointment for further evaluation and management  At any point if you feel like your symptoms are worsening prior to being seen by the eye doctor please go to the nearest emergency department for more thorough evaluation

## 2022-05-28 NOTE — ED Triage Notes (Signed)
Pt c/o left eye pain x2days  Pt states that her eye was cloudy and was seeing through a film but now feels like there is something in her eye.

## 2022-05-30 ENCOUNTER — Ambulatory Visit: Payer: Medicaid Other

## 2022-05-30 DIAGNOSIS — A749 Chlamydial infection, unspecified: Secondary | ICD-10-CM

## 2022-05-30 MED ORDER — AZITHROMYCIN 500 MG PO TABS
1000.0000 mg | ORAL_TABLET | Freq: Once | ORAL | Status: AC
  Administered 2022-05-30: 1000 mg via ORAL

## 2022-05-30 NOTE — ED Provider Notes (Signed)
[facility]    CSN: [id number] Arrival date & time: 05/28/22  1544      History   Chief Complaint Chief Complaint  Patient presents with   Eye Problem    HPI Kendra Oconnor is a 21 y.o. female.   Patient presents with left eye pain and cloudiness and blurry vision beginning 1 day ago.  Endorses symptoms worsened today and it now feels as if there is a foreign body within the eye.  Denies precipitating event, injury or trauma, drainage, pruritus, erythema.  Has not attempted treatment of symptoms.  Endorses that she has had similar symptoms in the past that resolved spontaneously.  Does not use contacts.   Past Medical History:  Diagnosis Date   Anemia    Pregnancy   Asthma    Mental disorder    Anxiety    Patient Active Problem List   Diagnosis Date Noted   Trichomonas infection 03/01/21 03/01/2021   Marijuana use daily 03/01/2021   Asthma 03/01/2021    Past Surgical History:  Procedure Laterality Date   NO PAST SURGERIES      OB History     Gravida  1   Para  1   Term  1   Preterm  0   AB  0   Living  1      SAB  0   IAB  0   Ectopic  0   Multiple  0   Live Births  1            Home Medications    Prior to Admission medications   Medication Sig Start Date End Date Taking? Authorizing Provider  moxifloxacin (VIGAMOX) 0.5 % ophthalmic solution Place 1 drop into the left eye 3 (three) times daily. 05/28/22  Yes Zelia Yzaguirre R, NP  albuterol (VENTOLIN HFA) 108 (90 Base) MCG/ACT inhaler Inhale 2 puffs into the lungs every 6 (six) hours as needed for wheezing or shortness of breath. Patient not taking: Reported on 03/01/2021 10/18/20   Orvil Feil, PA-C    Family History Family History  Problem Relation Age of Onset   Hypertension Maternal Grandmother    Diabetes Maternal Grandfather     Social History Social History   Tobacco Use   Smoking status: Former    Types: E-cigarettes   Smokeless tobacco: Never  Vaping  Use   Vaping Use: Never used  Substance Use Topics   Alcohol use: Yes    Alcohol/week: 1.0 standard drink of alcohol    Types: 1 Standard drinks or equivalent per week    Comment: last use 05/16/22 qo weekend   Drug use: Not Currently    Frequency: 2.0 times per week    Types: Marijuana    Comment: last use 05/16/22     Allergies   Patient has no known allergies.   Review of Systems Review of Systems  Constitutional: Negative.   Eyes:  Positive for pain and visual disturbance. Negative for photophobia, discharge, redness and itching.  Respiratory: Negative.    Cardiovascular: Negative.   Skin: Negative.      Physical Exam Triage Vital Signs ED Triage Vitals  Enc Vitals Group     BP 05/28/22 1556 105/76     Pulse Rate 05/28/22 1556 83     Resp 05/28/22 1556 18     Temp 05/28/22 1556 98.4 F (36.9 C)     Temp Source 05/28/22 1556 Oral     SpO2 05/28/22 1556 100 %  Weight 05/28/22 1555 145 lb (65.8 kg)     Height 05/28/22 1555 5\' 7"  (1.702 m)     Head Circumference --      Peak Flow --      Pain Score 05/28/22 1555 8     Pain Loc --      Pain Edu? --      Excl. in GC? --    No data found.  Updated Vital Signs BP 105/76 (BP Location: Left Arm)   Pulse 83   Temp 98.4 F (36.9 C) (Oral)   Resp 18   Ht 5\' 7"  (1.702 m)   Wt 145 lb (65.8 kg)   LMP 05/01/2022 (Approximate)   SpO2 100%   BMI 22.71 kg/m   Visual Acuity Right Eye Distance: 20/40 Left Eye Distance:  (Pt could not make out the 20/200) Bilateral Distance: 20/40  Right Eye Near:   Left Eye Near:    Bilateral Near:   (Pt wears glasses)  Physical Exam Constitutional:      Appearance: Normal appearance.  Eyes:     Comments: Unable to visualize foreign body, no overt abnormalities noted to the iris, sclera or conjunctiva, extraocular movements are intact, PERRLA  Pulmonary:     Effort: Pulmonary effort is normal.  Neurological:     Mental Status: She is alert.      UC Treatments /  Results  Labs (all labs ordered are listed, but only abnormal results are displayed) Labs Reviewed - No data to display  EKG   Radiology No results found.  Procedures Procedures (including critical care time)  Medications Ordered in UC Medications - No data to display  Initial Impression / Assessment and Plan / UC Course  I have reviewed the triage vital signs and the nursing notes.  Pertinent labs & imaging results that were available during my care of the patient were reviewed by me and considered in my medical decision making (see chart for details).  Left eye pain  Vision is less into the left eye on acuity, no further abnormalities, eye washing completed in office, endorses very mild improvement, moxifloxacin prescribed prophylactically, given walking referral to ophthalmology for further evaluation and management, given strict precautions for worsening symptoms to go to the nearest emergency department Final Clinical Impressions(s) / UC Diagnoses   Final diagnoses:  Left eye pain     Discharge Instructions      Very abnormalities are noted on your exam, you were able to track objects and your movement of your eye is intact  We have attempted eye washing to help minimize your symptoms but were unsuccessful, you may continue to do so at home  We will prophylactically place you on antibiotics, Place 1 drop in the eye every 8 hours for the next 7 days  Please call the eye doctor tomorrow, information on the front of your paperwork to schedule an appointment for further evaluation and management  At any point if you feel like your symptoms are worsening prior to being seen by the eye doctor please go to the nearest emergency department for more thorough evaluation   ED Prescriptions     Medication Sig Dispense Auth. Provider   moxifloxacin (VIGAMOX) 0.5 % ophthalmic solution Place 1 drop into the left eye 3 (three) times daily. 3 mL , NP       PDMP not reviewed this encounter.   07/02/2022, Valinda Hoar 05/30/22 [id number]

## 2022-05-30 NOTE — Progress Notes (Signed)
In nurse clinic for chlamydia tx. LMP 04/29/2022. No BCM. Last sex 05/25/2022. Treated today per [facility] Dr Karyl Kinnier with Azithromycin 1 gram by mouth DOT  once. Reviewed medication with pt and advised to call [facility] if vomits within 2 hrs of taking med. Questions answered and reports understanding. Jerel Shepherd, RN

## 2022-08-25 ENCOUNTER — Ambulatory Visit (INDEPENDENT_AMBULATORY_CARE_PROVIDER_SITE_OTHER): Payer: Medicaid Other

## 2022-08-25 VITALS — BP 101/64 | HR 79 | Resp 16 | Wt 136.9 lb

## 2022-08-25 DIAGNOSIS — Z3201 Encounter for pregnancy test, result positive: Secondary | ICD-10-CM

## 2022-08-25 DIAGNOSIS — N912 Amenorrhea, unspecified: Secondary | ICD-10-CM

## 2022-08-25 LAB — POCT URINE PREGNANCY: Preg Test, Ur: POSITIVE — AB

## 2022-08-25 NOTE — Progress Notes (Signed)
Subjective:    Kendra Oconnor is a 21 y.o. female who presents for evaluation of amenorrhea. She believes she could be pregnant. Pregnancy is not desired.  Last period was normal.   No LMP recorded. The following portions of the patient's history were reviewed and updated as appropriate: allergies, current medications, and problem list.   Lab Review Urine HCG: positive    Assessment:    Absence of menstruation.     Plan:    Pregnancy Test:  Positive: EDC: 04/14/2023. Briefly discussed positive results and sent to check out for scheduling for New OB appointments.

## 2022-08-25 NOTE — Patient Instructions (Signed)

## 2022-09-04 ENCOUNTER — Ambulatory Visit: Payer: Medicaid Other

## 2022-09-04 ENCOUNTER — Telehealth: Payer: Self-pay | Admitting: Advanced Practice Midwife

## 2022-09-04 NOTE — Telephone Encounter (Signed)
Reached out to pt to reschedule NEW OB Intake Nurse visit that was scheduled for 11/13 at 10:15.  Could not leave a message, number was not in service.  Will send a MyChart message to pt.

## 2022-09-05 ENCOUNTER — Encounter: Payer: Self-pay | Admitting: Advanced Practice Midwife

## 2022-09-05 NOTE — Telephone Encounter (Signed)
Reached out to pt (2x) to reschdule NEW OB Intake Nurse visit that was scheduled for 11/13 at 10:15.  Could not leave a message, call could not be completed at the time.  Will send a MyCharr letter to pt.

## 2022-09-07 NOTE — Telephone Encounter (Signed)
I contacted patient via phone.Could not leave a message, call could not be completed at the time . Patient hasn't read mychart message

## 2022-09-18 ENCOUNTER — Other Ambulatory Visit: Payer: Medicaid Other

## 2022-09-19 ENCOUNTER — Other Ambulatory Visit: Payer: Medicaid Other

## 2022-09-27 ENCOUNTER — Encounter: Payer: Medicaid Other | Admitting: Advanced Practice Midwife

## 2022-11-15 ENCOUNTER — Ambulatory Visit: Payer: Medicaid Other

## 2022-12-05 ENCOUNTER — Ambulatory Visit: Payer: Medicaid Other

## 2023-03-16 ENCOUNTER — Ambulatory Visit
Admission: RE | Admit: 2023-03-16 | Discharge: 2023-03-16 | Disposition: A | Discharge status: Home / Self Care | Payer: Medicaid Other | Source: Ambulatory Visit | Attending: Emergency Medicine | Admitting: Emergency Medicine

## 2023-03-16 VITALS — BP 99/64 | HR 83 | Temp 98.7°F | Resp 14 | Ht 67.0 in | Wt 130.0 lb

## 2023-03-16 DIAGNOSIS — N760 Acute vaginitis: Secondary | ICD-10-CM | POA: Insufficient documentation

## 2023-03-16 DIAGNOSIS — B9689 Other specified bacterial agents as the cause of diseases classified elsewhere: Secondary | ICD-10-CM | POA: Insufficient documentation

## 2023-03-16 LAB — URINALYSIS, W/ REFLEX TO CULTURE (INFECTION SUSPECTED)
Bilirubin Urine: NEGATIVE
Glucose, UA: NEGATIVE mg/dL
Hgb urine dipstick: NEGATIVE
Leukocytes,Ua: NEGATIVE
Nitrite: NEGATIVE
Protein, ur: NEGATIVE mg/dL
Specific Gravity, Urine: 1.025 (ref 1.005–1.030)
pH: 7 (ref 5.0–8.0)

## 2023-03-16 LAB — WET PREP, GENITAL
Sperm: NONE SEEN
Trich, Wet Prep: NONE SEEN
WBC, Wet Prep HPF POC: >=10 — AB (ref <10)
Yeast Wet Prep HPF POC: NONE SEEN

## 2023-03-16 MED ORDER — METRONIDAZOLE 0.75 % VA GEL: 1.0000 | Freq: Every day | VAGINAL | 0 refills | Status: DC

## 2023-03-16 NOTE — ED Triage Notes (Signed)
Patient c/o vaginal itching and discharge that started about 3 days ago.  Patient reports some urinary frequency.

## 2023-03-16 NOTE — Discharge Instructions (Addendum)
Today you are being treated  for  Bacterial vaginosis   Swab positive for bacterial vaginosis, negative for yeast and trichomoniasis  Urinalysis negative for infection  Use MetroGel every night before bed  Bacterial vaginosis which results from an overgrowth of one on several organisms that are normally present in your vagina. Vaginosis is an inflammation of the vagina that can result in discharge, itching and pain.  Labs pending  you will be contacted if positive for any sti and treatment will be sent to the pharmacy, you will have to return to the clinic if positive for gonorrhea to receive treatment   Please refrain from having sex until labs results, if positive please refrain from having sex until treatment complete and symptoms resolve   If positive for Chlamydia  gonorrhea  please notify partner or partners so they may tested as well  Moving forward, it is recommended you use some form of protection against the transmission of sti infections  such as condoms or dental dams with each sexual encounter     In addition: Avoid baths, hot tubs and whirlpool spas.  Don't use scented or harsh soaps Avoid irritants. These include scented tampons and pads. Wipe from front to back after using the toilet. Don't douche. Your vagina doesn't require cleansing other than normal bathing.  Use a condom.  Wear cotton underwear, this fabric absorbs some moisture.

## 2023-03-16 NOTE — ED Provider Notes (Signed)
[facility]    CSN: [id number] Arrival date & time: 03/16/23  1654      History   Chief Complaint Chief Complaint  Patient presents with   Vaginal Itching    Appointment    HPI Kendra Oconnor is a 22 y.o. female.   Patient presents for evaluation of Andry Bogden thick vaginal discharge with odor and itching present for 3 days.  Associated urinary frequency.  Has not attempted treatment.  Sexually active, 1 partner, sometimes condom use, no known exposure.  Denies abdominal pain or pressure, flank pain, dysuria, hematuria.  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 Authorizing Provider  albuterol (VENTOLIN HFA) 108 (90 Base) MCG/ACT inhaler Inhale 2 puffs into the lungs every 6 (six) hours as needed for wheezing or shortness of breath. Patient not taking: Reported on 03/01/2021 10/18/20   Orvil Feil, PA-C  moxifloxacin (VIGAMOX) 0.5 % ophthalmic solution Place 1 drop into the left eye 3 (three) times daily. 05/28/22   Valinda Hoar, NP    Family History Family History  Problem Relation Age of Onset   Hypertension Maternal Grandmother    Diabetes Maternal Grandfather     Social History Social History   Tobacco Use   Smoking status: Former    Types: E-cigarettes   Smokeless tobacco: Never  Vaping Use   Vaping Use: Never used  Substance Use Topics    Alcohol use: Yes    Alcohol/week: 1.0 standard drink of alcohol    Types: 1 Standard drinks or equivalent per week    Comment: last use 05/16/22 qo weekend   Drug use: Not Currently    Frequency: 2.0 times per week    Types: Marijuana    Comment: last use 05/16/22     Allergies   Patient has no known allergies.   Review of Systems Review of Systems  Constitutional: Negative.   HENT: Negative.    Respiratory: Negative.    Cardiovascular: Negative.   Genitourinary:  Positive for frequency and vaginal discharge. Negative for decreased urine volume, difficulty urinating, dyspareunia, dysuria, enuresis, flank pain, genital sores, hematuria, menstrual problem, pelvic pain, urgency, vaginal bleeding and vaginal pain.     Physical Exam Triage Vital Signs ED Triage Vitals  Enc Vitals Group     BP 03/16/23 1708 99/64     Pulse Rate 03/16/23 1708 83     Resp 03/16/23 1708 14     Temp 03/16/23 1708 98.7 F (37.1 C)     Temp Source 03/16/23 1708 Oral  SpO2 03/16/23 1708 97 %     Weight 03/16/23 1706 130 lb (59 kg)     Height 03/16/23 1706 5\' 7"  (1.702 m)     Head Circumference --      Peak Flow --      Pain Score 03/16/23 1706 4     Pain Loc --      Pain Edu? --      Excl. in GC? --    No data found.  Updated Vital Signs BP 99/64 (BP Location: Right Arm)   Pulse 83   Temp 98.7 F (37.1 C) (Oral)   Resp 14   Ht 5\' 7"  (1.702 m)   Wt 130 lb (59 kg)   LMP 03/02/2023 (Approximate)   SpO2 97%   Breastfeeding No   BMI 20.36 kg/m   Visual Acuity Right Eye Distance:   Left Eye Distance:   Bilateral Distance:    Right Eye Near:   Left Eye Near:    Bilateral Near:     Physical Exam Constitutional:      Appearance: Normal appearance.  Eyes:     Extraocular Movements: Extraocular movements intact.  Pulmonary:     Effort: Pulmonary effort is normal.  Skin:    General: Skin is warm and dry.  Neurological:     Mental Status: She is alert and oriented to person,  place, and time. Mental status is at baseline.      UC Treatments / Results  Labs (all labs ordered are listed, but only abnormal results are displayed) Labs Reviewed  WET PREP, GENITAL - Abnormal; Notable for the following components:      Result Value   Clue Cells Wet Prep HPF POC PRESENT (*)    WBC, Wet Prep HPF POC >=10 (*)    All other components within normal limits  URINALYSIS, W/ REFLEX TO CULTURE (INFECTION SUSPECTED) - Abnormal; Notable for the following components:   Ketones, ur TRACE (*)    Bacteria, UA MANY (*)    All other components within normal limits  CERVICOVAGINAL ANCILLARY ONLY    EKG   Radiology No results found.  Procedures Procedures (including critical care time)  Medications Ordered in UC Medications - No data to display  Initial Impression / Assessment and Plan / UC Course  I have reviewed the triage vital signs and the nursing notes.  Pertinent labs & imaging results that were available during my care of the patient were reviewed by me and considered in my medical decision making (see chart for details).  Bacterial vaginosis  Confirmed on wet prep, negative for trichomoniasis and yeast, urinalysis negative, gonorrhea and chlamydia pending, discussed findings with patient, MetroGel prescribed, discussed additional supportive measures, advised abstinence until lab results treatment is complete and all symptoms are resolved, may follow-up with urgent care as needed Final Clinical Impressions(s) / UC Diagnoses   Final diagnoses:  None     Discharge Instructions      Today you are being treated  for  Bacterial vaginosis   Swab positive for bacterial vaginosis, negative for yeast and trichomoniasis  Urinalysis negative for infection  Take Metronidazole 500 mg twice a day for 7 days, do not drink alcohol while using medication, this will make you feel sick   Bacterial vaginosis which results from an overgrowth of one on several organisms  that are normally present in your vagina. Vaginosis is an inflammation of the vagina that can result in discharge, itching and pain.  Labs pending  you will be contacted if positive for any sti and treatment will be sent to the pharmacy, you will have to return to the clinic if positive for gonorrhea to receive treatment   Please refrain from having sex until labs results, if positive please refrain from having sex until treatment complete and symptoms resolve   If positive for Chlamydia  gonorrhea  please notify partner or partners so they may tested as well  Moving forward, it is recommended you use some form of protection against the transmission of sti infections  such as condoms or dental dams with each sexual encounter     In addition: Avoid baths, hot tubs and whirlpool spas.  Don't use scented or harsh soaps Avoid irritants. These include scented tampons and pads. Wipe from front to back after using the toilet. Don't douche. Your vagina doesn't require cleansing other than normal bathing.  Use a condom.  Wear cotton underwear, this fabric absorbs some moisture.        ED Prescriptions   None    PDMP not reviewed this encounter.   Valinda Hoar, [place] 03/16/23 1801

## 2023-03-20 LAB — CERVICOVAGINAL ANCILLARY ONLY
Chlamydia: NEGATIVE
Comment: NEGATIVE
Comment: NORMAL
Neisseria Gonorrhea: NEGATIVE

## 2023-07-27 NOTE — Progress Notes (Signed)
 Assessment and Plan:   Diagnoses and all orders for this visit:  Encounter for annual physical exam  PHQ9 - 6 GAD - 0   -     Pap Smear -     TSH -     Vitamin D 25 Hydroxy (25OH D2 + D3) -     Lipid Panel -     CBC -     Comprehensive Metabolic Panel -     Hemoglobin A1c -     HPV DNA HI RISK  Mass Left Breast  Pt continues with mass of left breast at 12 o'clock position  She reports that she was told it was nothing to worry about  However, on further review of US  (after pt left the office), she was to have diagnostic US  and close interval follow up in 6 months - Findings were reported to have been discussed with pt  She did not follow up with diagnostic mammogram  Will reach out to pt and discuss recommendations and see if she would like to proceed with follow up   Vaginal discharge  PAP performed today  Pt had denied vaginal discharge  But, on exam large amount of discharge with fishy odor noted so wet prep obtained   -     Wet prep, genital  Vaginal odor -     Wet prep, genital  Screening for cervical cancer -     Pap Smear -     HPV DNA HI RISK  Screening for thyroid disorder -     TSH  Encounter for vitamin deficiency screening -     Vitamin D 25 Hydroxy (25OH D2 + D3)  Screening for lipid disorders -     Lipid Panel  Screening for deficiency anemia -     CBC  Screening for diabetes mellitus -     Hemoglobin A1c  Flu vaccine need -     INFLUENZA VACCINE IIV3(IM)(PF)6 MOS UP    22 y.o. female here for routine PE, doing well  Discussed importance of preventative exams including: - monthly self breast exams - bi annual dental exams - annual eye exams - annual dermatology visit for skin check & sunscreen protection  Return in about 1 year (around 07/26/2024) for Annual physical.or sooner as needed   Health care  maintenance: Colonoscopy - Due at age 89, no family historysooner if symptomatic  Mammogram-due at age 37, no family history, sooner  if symptomatic Pap- No results found for: INTERPGYN, SPECADGYN, HPVRES  - completed today Screening Low Dose CT (age 25-80 yearly if 30pk year history and those who quit in past 15 years as per USPSTF)- n/a never smoker  DEXA- Due age 73 Hep C screening (age 37-35 year old as per USPSTF guidelines)-  Lab Results  Component Value Date   HEPCAB Nonreactive 04/27/2023   UTD, results reviewed  HIV screening(as per USPSTF age 22-65)-   Lab Results  Component Value Date   HIV Nonreactive 04/27/2023   patient declines Pneumovax/Prevnar- N/A ,   Tdap-UTDTdaP 07/10/2019  COVID vaccine-Patient declines     Flu Vaccine- Given todayINFLUENZA VACCINE IIV3(IM)(PF)6 MOS UP 07/27/2023   Prior Labs: Lab Results  Component Value Date   A1C 5.1 07/27/2023    No results found for: LIPID Lab Results  Component Value Date   NA 140 07/27/2023   K 3.7 07/27/2023   CL 106 07/27/2023   ANIONGAP 6 07/27/2023   CO2 28.0 07/27/2023   BUN 11 07/27/2023  CREATININE 0.66 07/27/2023   BCR 17 07/27/2023   GLU 88 07/27/2023   CALCIUM 9.9 07/27/2023   ALBUMIN 4.3 07/27/2023   PROT 7.7 07/27/2023   BILITOT 0.4 07/27/2023   AST 20 07/27/2023   ALT 10 07/27/2023   ALKPHOS 112 07/27/2023    Lab Results  Component Value Date   WBC 4.5 07/27/2023   HGB 14.3 07/27/2023   HCT 41.3 07/27/2023   MCV 89.3 07/27/2023   RDW 12.5 07/27/2023   PLT 262 07/27/2023   NEUTROPCT 87.9 09/12/2021   LYMPHOPCT 6.1 09/12/2021   MONOPCT 5.5 09/12/2021   EOSPCT 0.1 09/12/2021   BASOPCT 0.4 09/12/2021      Subjective:   No chief complaint on file.   Kendra Oconnor 22 y.o.female  is being seen for a complete physical exam.  ROS:   Review of Systems  Constitutional:  Negative for chills, fever, malaise/fatigue and weight loss.  HENT:  Negative for congestion and hearing loss.   Eyes:  Negative for pain, discharge and redness.  Respiratory:  Negative for cough and shortness of breath.    Cardiovascular:  Negative for chest pain, palpitations and leg swelling.  Gastrointestinal:  Negative for abdominal pain, constipation, diarrhea, heartburn, nausea and vomiting.  Genitourinary:  Negative for dysuria.  Musculoskeletal:  Negative for back pain, myalgias and neck pain.  Skin:  Negative for rash.  Neurological:  Negative for dizziness, weakness and headaches.  Endo/Heme/Allergies:  Negative for polydipsia. Does not bruise/bleed easily.  Psychiatric/Behavioral:  Negative for depression. The patient is not nervous/anxious and does not have insomnia.      Vital Signs:     Visit Vitals BP 100/60 (BP Site: R Arm, BP Position: Sitting, BP Cuff Size: Small)  Pulse 78  Temp 37.1 C (98.7 F) (Temporal)  Ht 167.6 cm (5' 6)  Wt 61.1 kg (134 lb 11.2 oz)  LMP 07/21/2023 (Exact Date)  SpO2 98%  BMI 21.74 kg/m        07/27/23 1548  PainSc: 7      Objective:   Physical Exam: Physical Exam Constitutional:      General: She is not in acute distress.    Appearance: Normal appearance. She is well-developed and well-groomed. She is not ill-appearing.  HENT:     Head: Normocephalic and atraumatic.     Right Ear: Hearing, tympanic membrane, ear canal and external ear normal.     Left Ear: Hearing, tympanic membrane, ear canal and external ear normal.     Nose: Nose normal.     Mouth/Throat:     Lips: Pink.     Mouth: Mucous membranes are moist.     Pharynx: Oropharynx is clear. Uvula midline. No pharyngeal swelling, oropharyngeal exudate, posterior oropharyngeal erythema or uvula swelling.     Tonsils: No tonsillar exudate. 0 on the right. 0 on the left.  Eyes:     General: Lids are normal. Vision grossly intact. Gaze aligned appropriately.     Extraocular Movements: Extraocular movements intact.     Conjunctiva/sclera: Conjunctivae normal.     Pupils: Pupils are equal, round, and reactive to light.  Neck:     Thyroid: No thyroid mass, thyromegaly or thyroid tenderness.      Vascular: No carotid bruit or JVD.     Trachea: Trachea normal.  Cardiovascular:     Rate and Rhythm: Normal rate and regular rhythm.     Pulses:          Carotid pulses are 2+ on  the right side and 2+ on the left side.      Radial pulses are 2+ on the right side and 2+ on the left side.     Heart sounds: Normal heart sounds, S1 normal and S2 normal.  Pulmonary:     Effort: Pulmonary effort is normal.     Breath sounds: Normal breath sounds and air entry.  Chest:     Chest wall: No mass, deformity, swelling or tenderness.  Breasts:    Right: Normal.     Left: Mass present.     Comments: Continues with nodule to left breast at 12 o'clock position  Abdominal:     General: Bowel sounds are normal.     Palpations: Abdomen is soft.     Tenderness: There is no abdominal tenderness.     Hernia: No hernia is present. There is no hernia in the umbilical area, left inguinal area or right inguinal area.  Genitourinary:    General: Normal vulva.     Pubic Area: No rash or pubic lice.      Labia:        Right: No rash, tenderness, lesion or injury.        Left: No rash, tenderness, lesion or injury.      Urethra: No prolapse, urethral pain, urethral swelling or urethral lesion.     Vagina: Vaginal discharge present. No erythema, tenderness or bleeding.     Cervix: No cervical motion tenderness, discharge, friability, lesion, erythema or cervical bleeding.     Uterus: Normal.      Adnexa: Right adnexa normal and left adnexa normal.       Right: No mass or tenderness.         Left: No mass or tenderness.       Rectum: Normal.  Musculoskeletal:     Cervical back: Neck supple.     Right lower leg: No edema.     Left lower leg: No edema.  Lymphadenopathy:     Cervical: No cervical adenopathy.     Upper Body:     Right upper body: No supraclavicular, axillary or pectoral adenopathy.     Left upper body: No supraclavicular, axillary or pectoral adenopathy.     Lower Body: No right  inguinal adenopathy. No left inguinal adenopathy.  Skin:    General: Skin is warm and dry.     Findings: No rash.  Neurological:     Mental Status: She is alert and oriented to person, place, and time.     Cranial Nerves: No cranial nerve deficit.     Deep Tendon Reflexes:     Reflex Scores:      Bicep reflexes are 2+ on the right side and 2+ on the left side.      Patellar reflexes are 2+ on the right side and 2+ on the left side. Psychiatric:        Attention and Perception: Attention and perception normal.        Mood and Affect: Mood and affect normal.        Speech: Speech normal.        Behavior: Behavior normal. Behavior is cooperative.        Thought Content: Thought content normal.      Chaperone declined  Medication adherence and barriers to the treatment plan have been addressed. Opportunities to optimize healthy behaviors have been discussed. Patient / caregiver voiced understanding.

## 2023-08-16 ENCOUNTER — Ambulatory Visit
Admission: EM | Admit: 2023-08-16 | Discharge: 2023-08-16 | Disposition: A | Discharge status: Home / Self Care | Payer: Medicaid Other | Attending: Emergency Medicine | Admitting: Emergency Medicine

## 2023-08-16 DIAGNOSIS — Z1152 Encounter for screening for COVID-19: Secondary | ICD-10-CM | POA: Insufficient documentation

## 2023-08-16 DIAGNOSIS — J029 Acute pharyngitis, unspecified: Secondary | ICD-10-CM

## 2023-08-16 DIAGNOSIS — J028 Acute pharyngitis due to other specified organisms: Secondary | ICD-10-CM | POA: Insufficient documentation

## 2023-08-16 DIAGNOSIS — B9789 Other viral agents as the cause of diseases classified elsewhere: Secondary | ICD-10-CM | POA: Insufficient documentation

## 2023-08-16 LAB — RESP PANEL BY RT-PCR (FLU A&B, COVID) ARPGX2
Influenza A by PCR: NEGATIVE
Influenza B by PCR: NEGATIVE
SARS Coronavirus 2 by RT PCR: NEGATIVE

## 2023-08-16 LAB — GROUP A STREP BY PCR: Group A Strep by PCR: NOT DETECTED

## 2023-08-16 NOTE — Discharge Instructions (Addendum)
Your strep test today was negative, as were your COVID and influenza tests.  I do believe that your symptoms are being caused by a respiratory virus.  Gargle with warm salt water 2-3 times a day to soothe your throat, aid in pain relief, and aid in healing.  Take over-the-counter Tylenol and/or ibuprofen according to the package instructions as needed for pain.  You can also use Chloraseptic or Sucrets lozenges, 1 lozenge every 2 hours as needed for throat pain.  If you develop any new or worsening symptoms return for reevaluation.

## 2023-08-16 NOTE — ED Provider Notes (Addendum)
[facility]    CSN: [id number] Arrival date & time: 08/16/23  0906      History   Chief Complaint Chief Complaint  Patient presents with   Sore Throat    HPI Kendra Oconnor is a 22 y.o. female.   HPI  22 year old female with a past medical history significant for asthma, anemia, and anxiety presents for evaluation of sore throat which began yesterday.  No associated fever.  She does endorse some nasal congestion, pain in her left ear, and a cough that is productive for green sputum.  She has been afebrile, had no nasal discharge, and denies shortness breath or wheezing.  She also denies any sick contacts with similar symptoms or any recent travel.  Past Medical History:  Diagnosis Date   Anemia    Pregnancy   Asthma    Chlamydia infection 05/26/2022   Mental disorder    Anxiety    Patient Active Problem List   Diagnosis Date Noted   Trichomonas infection 03/01/21 03/01/2021   Marijuana use daily 03/01/2021   Asthma 03/01/2021    Past Surgical History:  Procedure Laterality Date   NO PAST SURGERIES      OB History     Gravida  2   Para  1   Term  1   Preterm  0   AB  0   Living  1      SAB  0   IAB  0   Ectopic  0   Multiple  0   Live Births  1            Home Medications    Prior to Admission medications   Not on File    Family History Family History  Problem Relation Age of Onset   Hypertension Maternal Grandmother    Diabetes Maternal Grandfather     Social History Social History   Tobacco Use   Smoking status: Former    Types: E-cigarettes   Smokeless tobacco: Never  Vaping Use   Vaping status: Never Used  Substance Use Topics   Alcohol use: Yes    Alcohol/week: 1.0 standard drink of alcohol    Types: 1 Standard drinks or equivalent per week    Comment: last use 05/16/22 qo weekend   Drug use: Not Currently    Frequency: 2.0 times per week    Types: Marijuana    Comment: last use 05/16/22      Allergies   Patient has no known allergies.   Review of Systems Review of Systems  Constitutional:  Negative for fever.  HENT:  Positive for congestion, ear pain and sore throat. Negative for rhinorrhea.   Respiratory:  Positive for cough. Negative for shortness of breath and wheezing.      Physical Exam Triage Vital Signs ED Triage Vitals [08/16/23 1044]  Encounter Vitals Group     BP      Systolic BP Percentile      Diastolic BP Percentile      Pulse      Resp 16     Temp      Temp Source Oral     SpO2      Weight      Height      Head Circumference      Peak Flow      Pain Score      Pain Loc      Pain Education      Exclude from Growth Chart  No data found.  Updated Vital Signs BP 102/69 (BP Location: Right Arm)   Pulse 79   Temp 99.1 F (37.3 C) (Oral)   Resp 16   Ht 5\' 6"  (1.676 m)   Wt 135 lb (61.2 kg)   LMP 07/21/2023 (Approximate)   SpO2 97%   BMI 21.79 kg/m   Visual Acuity Right Eye Distance:   Left Eye Distance:   Bilateral Distance:    Right Eye Near:   Left Eye Near:    Bilateral Near:     Physical Exam Vitals and nursing note reviewed.  Constitutional:      Appearance: Normal appearance. She is not ill-appearing.  HENT:     Head: Normocephalic and atraumatic.     Right Ear: Tympanic membrane, ear canal and external ear normal. There is no impacted cerumen.     Left Ear: Tympanic membrane, ear canal and external ear normal. There is no impacted cerumen.     Nose: Congestion and rhinorrhea present.     Comments: Nasal mucosa is markedly edematous with clear discharge in both nares.    Mouth/Throat:     Mouth: Mucous membranes are moist.     Pharynx: Oropharynx is clear. Posterior oropharyngeal erythema present. No oropharyngeal exudate.     Comments: Tonsillar pillars are unremarkable.  Posterior oropharynx demonstrates erythema with injection and lymphoid follicles. Neck:     Comments: Bilateral anterior cervical  lymphadenopathy with tenderness on the right side.  Nodes are freely mobile. Cardiovascular:     Rate and Rhythm: Normal rate and regular rhythm.     Pulses: Normal pulses.     Heart sounds: Normal heart sounds. No murmur heard.    No friction rub. No gallop.  Pulmonary:     Effort: Pulmonary effort is normal.     Breath sounds: Normal breath sounds. No wheezing, rhonchi or rales.  Musculoskeletal:     Cervical back: Normal range of motion and neck supple. Tenderness present.  Lymphadenopathy:     Cervical: Cervical adenopathy present.  Skin:    General: Skin is warm and dry.     Capillary Refill: Capillary refill takes less than 2 seconds.     Findings: No rash.  Neurological:     General: No focal deficit present.     Mental Status: She is alert and oriented to person, place, and time.      UC Treatments / Results  Labs (all labs ordered are listed, but only abnormal results are displayed) Labs Reviewed  GROUP A STREP BY PCR  RESP PANEL BY RT-PCR (FLU A&B, COVID) [id number]    EKG   Radiology No results found.  Procedures Procedures (including critical care time)  Medications Ordered in UC Medications - No data to display  Initial Impression / Assessment and Plan / UC Course  I have reviewed the triage vital signs and the nursing notes.  Pertinent labs & imaging results that were available during my care of the patient were reviewed by me and considered in my medical decision making (see chart for details).   Patient is a pleasant, nontoxic-appearing 22 year old female presenting for evaluation of respiratory symptoms as outlined HPI above.  She is not in any acute distress and she is able speak in full sentence without dyspnea or tachypnea.  Triage respiratory rate is 16 with a 97% room air oxygen saturation.  She denies a fever at home but she does have an elevated temp of 99.1 here in clinic.  Given information visualized  on exam of her upper respiratory tract I will  order a COVID and flu PCR.  Also due to her sore throat I will order a strep PCR.  Had to leave to pick up her daughter as a water pipe burst.  I told her that I would call her if she tested positive for either COVID or influenza.  If her tests are negative she will get her results from her MyChart.  Strep PCR is negative.  Respiratory panel is negative for COVID and influenza.  I will discharge patient home with a diagnosis of viral pharyngitis.  She can use over-the-counter Tylenol and or ibuprofen as needed for pain along with over-the-counter Chloraseptic or Sucrets lozenges.  Also salt water gargles.   Final Clinical Impressions(s) / UC Diagnoses   Final diagnoses:  Viral pharyngitis     Discharge Instructions      Your strep test today was negative, as were your COVID and influenza tests.  I do believe that your symptoms are being caused by a respiratory virus.  Gargle with warm salt water 2-3 times a day to soothe your throat, aid in pain relief, and aid in healing.  Take over-the-counter Tylenol and/or ibuprofen according to the package instructions as needed for pain.  You can also use Chloraseptic or Sucrets lozenges, 1 lozenge every 2 hours as needed for throat pain.  If you develop any new or worsening symptoms return for reevaluation.      ED Prescriptions   None    PDMP not reviewed this encounter.   Becky Augusta, NP 08/16/23 1208    Becky Augusta, NP 08/16/23 [phone]

## 2023-08-16 NOTE — ED Notes (Signed)
Pt had to leave due to having to p/u daughter from school. Water pipe bursted. Riki Rusk NP notified & instructed pt that if results + we would reach out. Pt verbalized understanding to info.

## 2023-08-16 NOTE — ED Triage Notes (Signed)
Pt c/o sore throat x1 day. Denies any fevers.  

## 2024-01-14 ENCOUNTER — Other Ambulatory Visit (HOSPITAL_COMMUNITY)
Admission: RE | Admit: 2024-01-14 | Discharge: 2024-01-14 | Disposition: A | Discharge status: Home / Self Care | Source: Ambulatory Visit | Attending: Obstetrics | Admitting: Obstetrics

## 2024-01-14 ENCOUNTER — Encounter: Payer: Self-pay | Admitting: Obstetrics

## 2024-01-14 ENCOUNTER — Ambulatory Visit (INDEPENDENT_AMBULATORY_CARE_PROVIDER_SITE_OTHER): Admitting: Obstetrics

## 2024-01-14 VITALS — BP 107/74 | HR 87 | Ht 65.0 in | Wt 139.0 lb

## 2024-01-14 DIAGNOSIS — N898 Other specified noninflammatory disorders of vagina: Secondary | ICD-10-CM | POA: Diagnosis not present

## 2024-01-14 DIAGNOSIS — B9689 Other specified bacterial agents as the cause of diseases classified elsewhere: Secondary | ICD-10-CM | POA: Diagnosis not present

## 2024-01-14 DIAGNOSIS — Z113 Encounter for screening for infections with a predominantly sexual mode of transmission: Secondary | ICD-10-CM | POA: Diagnosis present

## 2024-01-14 DIAGNOSIS — N761 Subacute and chronic vaginitis: Secondary | ICD-10-CM | POA: Diagnosis not present

## 2024-01-14 DIAGNOSIS — N760 Acute vaginitis: Secondary | ICD-10-CM | POA: Insufficient documentation

## 2024-01-14 NOTE — Progress Notes (Signed)
   GYN ENCOUNTER  Subjective  HPI: Kendra Oconnor is a 23 y.o. G2P1001 who presents today for vaginal discharge and bleeding during intercourse. She reports that for about a week, she has noticed increased white vaginal discharge with an odor, and she has had bright red bleeding with intercourse. She is currently sexually active with one female partner. She reports frequent BV infections. She had a normal Pap in October.  Past Medical History:  Diagnosis Date   Anemia    Pregnancy   Asthma    Chlamydia infection 05/26/2022   Mental disorder    Anxiety   Past Surgical History:  Procedure Laterality Date   NO PAST SURGERIES     OB History     Gravida  2   Para  1   Term  1   Preterm  0   AB  0   Living  1      SAB  0   IAB  0   Ectopic  0   Multiple  0   Live Births  1          No Known Allergies  ROS: See HPI   Objective  BP 107/74   Pulse 87   Ht 5\' 5"  (1.651 m)   Wt 139 lb (63 kg)   LMP 11/20/2023   BMI 23.13 kg/m   Physical examination   Pelvic:   Vulva: Normal appearance.  No lesions.  Vagina: No lesions or abnormalities noted.  Support: Normal pelvic support.  Urethra No masses tenderness or scarring.  Meatus Normal size without lesions or prolapse.  Cervix: Normal appearance.  No lesions. Friable. Moderate amount of frothy white discharge.  Perineum: Normal exam.  No lesions.    Assessment -Vaginal discharge -Post-coital bleeding  Plan -Swabs collected. F/u based on results -Discussed vaginal boric acid capsules for recurrent BV. She is already using unscented soap and uses condoms. Consider chronic BV treatment if swab is positive today.   Guadlupe Spanish, CNM

## 2024-01-15 ENCOUNTER — Other Ambulatory Visit: Payer: Self-pay | Admitting: Obstetrics

## 2024-01-15 ENCOUNTER — Encounter: Payer: Self-pay | Admitting: Obstetrics

## 2024-01-15 LAB — CERVICOVAGINAL ANCILLARY ONLY
Bacterial Vaginitis (gardnerella): POSITIVE — AB
Candida Glabrata: NEGATIVE
Candida Vaginitis: NEGATIVE
Chlamydia: NEGATIVE
Comment: NEGATIVE
Comment: NEGATIVE
Comment: NEGATIVE
Comment: NEGATIVE
Comment: NEGATIVE
Comment: NORMAL
Neisseria Gonorrhea: NEGATIVE
Trichomonas: NEGATIVE

## 2024-01-15 MED ORDER — METRONIDAZOLE 0.75 % VA GEL
1.0000 | Freq: Every day | VAGINAL | 5 refills | Status: AC
Start: 2024-01-15 — End: ?

## 2024-01-15 MED ORDER — METRONIDAZOLE 500 MG PO TABS
500.0000 mg | ORAL_TABLET | Freq: Two times a day (BID) | ORAL | 0 refills | Status: AC
Start: 2024-01-15 — End: ?

## 2024-01-15 NOTE — Progress Notes (Signed)
+  BV. Rx for metronidazole 500 mg PO BID x 7 days sent to pharmacy. Metrogel for chronic BV also sent. Aariona notified via MyChart and instructions given.  Quitman Livings, CNM

## 2024-01-22 ENCOUNTER — Other Ambulatory Visit

## 2024-01-23 LAB — HEP, RPR, HIV PANEL
HIV Screen 4th Generation wRfx: NONREACTIVE
Hepatitis B Surface Ag: NEGATIVE
RPR Ser Ql: NONREACTIVE

## 2024-11-04 ENCOUNTER — Emergency Department: Payer: Self-pay

## 2024-11-04 ENCOUNTER — Emergency Department
Admission: EM | Admit: 2024-11-04 | Discharge: 2024-11-05 | Disposition: A | Payer: Self-pay | Attending: Emergency Medicine | Admitting: Emergency Medicine

## 2024-11-04 DIAGNOSIS — R519 Headache, unspecified: Secondary | ICD-10-CM | POA: Insufficient documentation

## 2024-11-04 DIAGNOSIS — R202 Paresthesia of skin: Secondary | ICD-10-CM | POA: Insufficient documentation

## 2024-11-04 DIAGNOSIS — J45909 Unspecified asthma, uncomplicated: Secondary | ICD-10-CM | POA: Insufficient documentation

## 2024-11-04 DIAGNOSIS — R29818 Other symptoms and signs involving the nervous system: Secondary | ICD-10-CM | POA: Insufficient documentation

## 2024-11-04 LAB — DIFFERENTIAL
Abs Immature Granulocytes: 0.01 K/uL (ref 0.00–0.07)
Basophils Absolute: 0 K/uL (ref 0.0–0.1)
Basophils Relative: 1 %
Eosinophils Absolute: 0.2 K/uL (ref 0.0–0.5)
Eosinophils Relative: 3 %
Immature Granulocytes: 0 %
Lymphocytes Relative: 62 %
Lymphs Abs: 3.2 K/uL (ref 0.7–4.0)
Monocytes Absolute: 0.3 K/uL (ref 0.1–1.0)
Monocytes Relative: 6 %
Neutro Abs: 1.4 K/uL — ABNORMAL LOW (ref 1.7–7.7)
Neutrophils Relative %: 28 %

## 2024-11-04 LAB — CBC
HCT: 39.4 % (ref 36.0–46.0)
Hemoglobin: 13.2 g/dL (ref 12.0–15.0)
MCH: 30.1 pg (ref 26.0–34.0)
MCHC: 33.5 g/dL (ref 30.0–36.0)
MCV: 90 fL (ref 80.0–100.0)
Platelets: 242 K/uL (ref 150–400)
RBC: 4.38 MIL/uL (ref 3.87–5.11)
RDW: 11.7 % (ref 11.5–15.5)
WBC: 5.1 K/uL (ref 4.0–10.5)
nRBC: 0 % (ref 0.0–0.2)

## 2024-11-04 LAB — POC URINE PREG, ED: Preg Test, Ur: NEGATIVE

## 2024-11-04 LAB — COMPREHENSIVE METABOLIC PANEL WITH GFR
ALT: 8 U/L (ref 0–44)
AST: 16 U/L (ref 15–41)
Albumin: 4.5 g/dL (ref 3.5–5.0)
Alkaline Phosphatase: 87 U/L (ref 38–126)
Anion gap: 8 (ref 5–15)
BUN: 10 mg/dL (ref 6–20)
CO2: 28 mmol/L (ref 22–32)
Calcium: 9.7 mg/dL (ref 8.9–10.3)
Chloride: 102 mmol/L (ref 98–111)
Creatinine, Ser: 0.75 mg/dL (ref 0.44–1.00)
GFR, Estimated: >60 mL/min
Glucose, Bld: 93 mg/dL (ref 70–99)
Potassium: 3.7 mmol/L (ref 3.5–5.1)
Sodium: 138 mmol/L (ref 135–145)
Total Bilirubin: 0.4 mg/dL (ref 0.0–1.2)
Total Protein: 7.3 g/dL (ref 6.5–8.1)

## 2024-11-04 MED ORDER — METOCLOPRAMIDE HCL 10 MG PO TABS
10.0000 mg | ORAL_TABLET | Freq: Once | ORAL | Status: AC
  Administered 2024-11-04: 10 mg via ORAL
  Filled 2024-11-04: qty 1

## 2024-11-04 MED ORDER — GADOBUTROL 1 MMOL/ML IV SOLN
6.5000 mL | Freq: Once | INTRAVENOUS | Status: AC | PRN
  Administered 2024-11-04: 6.5 mL via INTRAVENOUS

## 2024-11-04 NOTE — ED Triage Notes (Signed)
 Pt presents to the ED via POV from home. Pt reports that she was driving down the road and started having a headache, bilateral vision changes and right sided numbness. States that this episode in total lasted about 30 minutes and the symptoms resolved. Reports this has happened in the past intermittently within the past two weeks.

## 2024-11-04 NOTE — ED Provider Notes (Signed)
 11:07 PM  Assumed care at shift change.  Here with HA, numbness, vision changes.  Family /o MS.  MRI brain pending.  12:32 AM  Pt's MRI brain reviewed and interpreted by myself the radiologist and shows no acute abnormality.  Suspect complex migraine.  Will give neurology follow-up information.   At this time, I do not feel there is any life-threatening condition present. I reviewed all nursing notes, vitals, pertinent previous records.  All lab and urine results, EKGs, imaging ordered have been independently reviewed and interpreted by myself.  I reviewed all available radiology reports from any imaging ordered this visit.  Based on my assessment, I feel the patient is safe to be discharged home without further emergent workup and can continue workup as an outpatient as needed. Discussed all findings, treatment plan as well as usual and customary return precautions.  They verbalize understanding and are comfortable with this plan.  Outpatient follow-up has been provided as needed.  All questions have been answered.'   Jessy Calixte, Josette SAILOR, DO 11/05/24 9966

## 2024-11-04 NOTE — Discharge Instructions (Signed)
 You can take Tylenol  and ibuprofen  as needed for headaches.  I put in a number for neurology in case you want a follow-up for your migraines.   You may alternate over the counter Tylenol  1000 mg every 6 hours as needed for pain, fever and Ibuprofen  800 mg every 6-8 hours as needed for pain, fever.  Please take Ibuprofen  with food.  Do not take more than 4000 mg of Tylenol  (acetaminophen ) in a 24 hour period.  Your MRI showed no acute abnormality today.

## 2024-11-04 NOTE — ED Provider Notes (Signed)
 [facility] Provider Note    Event Date/Time   First MD Initiated Contact with Patient 11/04/24 2003     (approximate)   History   Numbness   HPI  Kendra Oconnor is a 24 y.o. female with history of asthma, marijuana use, anxiety presenting with headache.  States it started while she was driving earlier today.  It was preceded by bilateral vision changes that she said felt like kaleidoscope, after that started to have a right sided headache.  Did feel some numbness and tingling to her right hand that progressed up to her forearm.  She denies any focal weakness.  No slurred speech.  States that her symptoms lasted for 30 minutes and resolved.  Does still have a mild headache.  States that the symptoms with her headache has occurred 2 other times in the past 2 weeks.  States that she has prior history of headaches but no associated symptoms with those headaches.  No prior history of strokes.  No family history of early strokes.  Does have family history of MS.  Denies any infectious symptoms.  On independent chart review, she was seen by primary care in 24, was there for annual checkup.  Is not on any medications at this time.     Physical Exam   Triage Vital Signs: ED Triage Vitals [11/04/24 1706]  Encounter Vitals Group     BP 114/68     Girls Systolic BP Percentile      Girls Diastolic BP Percentile      Boys Systolic BP Percentile      Boys Diastolic BP Percentile      Pulse Rate 79     Resp 18     Temp 98.2 F (36.8 C)     Temp Source Oral     SpO2 100 %     Weight 145 lb (65.8 kg)     Height 5' 6 (1.676 m)     Head Circumference      Peak Flow      Pain Score 2     Pain Loc      Pain Education      Exclude from Growth Chart     Most recent vital signs: Vitals:   11/04/24 1706  BP: 114/68  Pulse: 79  Resp: 18  Temp: 98.2 F (36.8 C)  SpO2: 100%     General: Awake, no distress.  CV:  Good peripheral perfusion.  Resp:  Normal  effort.  Abd:  No distention.  Other:  Pupils are equal, extraocular movements are intact, no cranial nerve deficits, no focal weakness or numbness.  No nuchal rigidity.  She is nontoxic-appearing.   ED Results / Procedures / Treatments   Labs (all labs ordered are listed, but only abnormal results are displayed) Labs Reviewed  DIFFERENTIAL - Abnormal; Notable for the following components:      Result Value   Neutro Abs 1.4 (*)    All other components within normal limits  CBC  COMPREHENSIVE METABOLIC PANEL WITH GFR  POC URINE PREG, ED     RADIOLOGY On my independent interpretation, CT head without obvious intracranial hemorrhage   PROCEDURES:  Critical Care performed: No  Procedures   MEDICATIONS ORDERED IN ED: Medications  metoCLOPramide  (REGLAN ) tablet 10 mg (10 mg Oral Given 11/04/24 2025)  gadobutrol  (GADAVIST ) 1 MMOL/ML injection 6.5 mL (6.5 mLs Intravenous Contrast Given 11/04/24 2210)     IMPRESSION / MDM / ASSESSMENT AND PLAN /  ED COURSE  I reviewed the triage vital signs and the nursing notes.                              Differential diagnosis includes, but is not limited to, complex migraines, tension headache, anxiety, did consider TIA but she has no other risk factors for it.  Did also consider MS given family history of it.  Labs and CT were obtained out of triage.  Will add on some Reglan  as well as an MRI brain with and without.  Stroke alert was activated given that her symptoms are completely resolved and she has no focal deficits at this time.  Patient's presentation is most consistent with acute presentation with potential threat to life or bodily function.  Independent interpretation of labs and imaging below.  Reassessment patient is feeling a lot better.  Still pending MRI results.  Did discuss with her that if it is negative, she can be discharged, will give her a number to call for neurology if she wants a follow-up patient for possible complex  migraines.  Pt signed out pending Mri results.   Clinical Course as of 11/04/24 2240  Tue Nov 04, 2024  2003 CT HEAD WO CONTRAST 1. No acute intracranial abnormality.  [TT]  2014 Independent reviewed labs, no leukocytosis, electrolytes severely deranged, LFTs are normal. [TT]  2055 Preg Test, Ur: NEGATIVE [TT]    Clinical Course User Index [TT] Waymond Lorelle Cummins, MD     FINAL CLINICAL IMPRESSION(S) / ED DIAGNOSES   Final diagnoses:  Nonintractable headache, unspecified chronicity pattern, unspecified headache type  Transient neurological symptoms     Rx / DC Orders   ED Discharge Orders     None        Note:  This document was prepared using Dragon voice recognition software and may include unintentional dictation errors.    Waymond Lorelle Cummins, MD 11/04/24 [phone]
